# Patient Record
Sex: Female | Born: 1990 | Race: White | Hispanic: No | Marital: Married | Smoking: Never smoker
Health system: Southern US, Community
[De-identification: ages and names within clinical notes are randomized; demographics above are authoritative.]

## PROBLEM LIST (undated history)

## (undated) ENCOUNTER — Inpatient Hospital Stay (HOSPITAL_COMMUNITY): Payer: Self-pay

## (undated) DIAGNOSIS — F32A Depression, unspecified: Secondary | ICD-10-CM

## (undated) DIAGNOSIS — F419 Anxiety disorder, unspecified: Secondary | ICD-10-CM

## (undated) DIAGNOSIS — F329 Major depressive disorder, single episode, unspecified: Secondary | ICD-10-CM

## (undated) DIAGNOSIS — G43909 Migraine, unspecified, not intractable, without status migrainosus: Secondary | ICD-10-CM

## (undated) DIAGNOSIS — I10 Essential (primary) hypertension: Secondary | ICD-10-CM

## (undated) DIAGNOSIS — Z9089 Acquired absence of other organs: Secondary | ICD-10-CM

## (undated) DIAGNOSIS — IMO0002 Reserved for concepts with insufficient information to code with codable children: Secondary | ICD-10-CM

## (undated) DIAGNOSIS — E282 Polycystic ovarian syndrome: Secondary | ICD-10-CM

## (undated) DIAGNOSIS — Z915 Personal history of self-harm: Secondary | ICD-10-CM

## (undated) DIAGNOSIS — B019 Varicella without complication: Secondary | ICD-10-CM

## (undated) DIAGNOSIS — K08409 Partial loss of teeth, unspecified cause, unspecified class: Secondary | ICD-10-CM

## (undated) DIAGNOSIS — Z9151 Personal history of suicidal behavior: Secondary | ICD-10-CM

## (undated) HISTORY — PX: TONSILLECTOMY: SUR1361

## (undated) HISTORY — DX: Reserved for concepts with insufficient information to code with codable children: IMO0002

## (undated) HISTORY — DX: Varicella without complication: B01.9

## (undated) HISTORY — DX: Depression, unspecified: F32.A

## (undated) HISTORY — DX: Major depressive disorder, single episode, unspecified: F32.9

## (undated) HISTORY — DX: Anxiety disorder, unspecified: F41.9

## (undated) NOTE — *Deleted (*Deleted)
Great to see you again today!  

---
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
## 2011-10-20 ENCOUNTER — Encounter (HOSPITAL_BASED_OUTPATIENT_CLINIC_OR_DEPARTMENT_OTHER): Payer: Self-pay | Admitting: *Deleted

## 2011-10-20 ENCOUNTER — Emergency Department (HOSPITAL_BASED_OUTPATIENT_CLINIC_OR_DEPARTMENT_OTHER)
Admission: EM | Admit: 2011-10-20 | Discharge: 2011-10-20 | Disposition: A | Discharge status: Home / Self Care | Payer: 59 | Attending: Emergency Medicine | Admitting: Emergency Medicine

## 2011-10-20 DIAGNOSIS — J029 Acute pharyngitis, unspecified: Secondary | ICD-10-CM | POA: Insufficient documentation

## 2011-10-20 HISTORY — DX: Polycystic ovarian syndrome: E28.2

## 2011-10-20 HISTORY — DX: Migraine, unspecified, not intractable, without status migrainosus: G43.909

## 2011-10-20 LAB — URINALYSIS, ROUTINE W REFLEX MICROSCOPIC
Nitrite: POSITIVE — AB
Protein, ur: 100 mg/dL — AB
Urobilinogen, UA: 1 mg/dL (ref 0.0–1.0)

## 2011-10-20 LAB — RAPID STREP SCREEN (MED CTR MEBANE ONLY): Streptococcus, Group A Screen (Direct): NEGATIVE

## 2011-10-20 LAB — URINE MICROSCOPIC-ADD ON

## 2011-10-20 LAB — PREGNANCY, URINE: Preg Test, Ur: NEGATIVE

## 2011-10-20 MED ORDER — ONDANSETRON HCL 4 MG/2ML IJ SOLN
4.0000 mg | Freq: Once | INTRAMUSCULAR | Status: AC
Start: 2011-10-20 — End: 2011-10-20
  Administered 2011-10-20: 4 mg via INTRAVENOUS
  Filled 2011-10-20: qty 2

## 2011-10-20 MED ORDER — SODIUM CHLORIDE 0.9 % IV SOLN
Freq: Once | INTRAVENOUS | Status: AC
  Administered 2011-10-20: 14:00:00 via INTRAVENOUS

## 2011-10-20 MED ORDER — IBUPROFEN 800 MG PO TABS: 800.0000 mg | ORAL_TABLET | Freq: Three times a day (TID) | ORAL | Status: AC

## 2011-10-20 MED ORDER — IBUPROFEN 800 MG PO TABS
800.0000 mg | ORAL_TABLET | Freq: Once | ORAL | Status: AC
  Administered 2011-10-20: 800 mg via ORAL
  Filled 2011-10-20: qty 1

## 2011-10-20 MED ORDER — PROMETHAZINE HCL 25 MG RE SUPP: 25.0000 mg | Freq: Four times a day (QID) | RECTAL | Status: DC | PRN

## 2011-10-20 MED ORDER — SODIUM CHLORIDE 0.9 % IV BOLUS (SEPSIS)
1000.0000 mL | Freq: Once | INTRAVENOUS | Status: AC
  Administered 2011-10-20: 1000 mL via INTRAVENOUS

## 2011-10-20 NOTE — ED Notes (Signed)
Ice chips given for po challenge

## 2011-10-20 NOTE — ED Provider Notes (Signed)
History     CSN: [id number]  Arrival date & time 10/20/11  1235   First MD Initiated Contact with Patient 10/20/11 1334      Chief Complaint  Patient presents with  . Sore Throat    (Consider location/radiation/quality/duration/timing/severity/associated sxs/prior treatment) Patient is a 21 y.o. female presenting with pharyngitis. The history is provided by the patient. No language interpreter was used.  Sore Throat This is a new problem. The current episode started today. The problem occurs constantly. The problem has been gradually worsening. Associated symptoms include a sore throat. Nothing aggravates the symptoms. She has tried nothing for the symptoms. The treatment provided moderate relief.  Pt complains of a sore throat and vomiting.  Mother reports pt has a history of vomitting when she has strep or a throat infection  Past Medical History  Diagnosis Date  . Polycystic ovarian disease   . Migraines     Past Surgical History  Procedure Date  . Tonsillectomy     History reviewed. No pertinent family history.  History  Substance Use Topics  . Smoking status: Never Smoker   . Smokeless tobacco: Not on file  . Alcohol Use: No    OB History    Grav Para Term Preterm Abortions TAB SAB Ect Mult Living                  Review of Systems  HENT: Positive for sore throat.   All other systems reviewed and are negative.    Allergies  Review of patient's allergies indicates no known allergies.  Home Medications   Current Outpatient Rx  Name Route Sig Dispense Refill  . CITALOPRAM HYDROBROMIDE PO Oral Take 1 capsule by mouth daily.    Marland Kitchen OMEPRAZOLE 20 MG PO CPDR Oral Take 20 mg by mouth daily.      BP 127/72  Pulse 130  Temp 101.8 F (38.8 C) (Oral)  Resp 20  SpO2 97%  LMP 10/13/2011  Physical Exam  Nursing note and vitals reviewed. Constitutional: She appears well-developed and well-nourished.  HENT:  Head: Normocephalic and atraumatic.  Right Ear:  External ear normal.  Left Ear: External ear normal.       Throat erythematous  Eyes: Conjunctivae and EOM are normal. Pupils are equal, round, and reactive to light.  Neck: Normal range of motion. Neck supple.  Cardiovascular: Normal rate and normal heart sounds.   Pulmonary/Chest: Effort normal and breath sounds normal.  Abdominal: Soft.  Musculoskeletal: Normal range of motion.  Neurological: She is alert.  Skin: Skin is warm.  Psychiatric: She has a normal mood and affect.    ED Course  Procedures (including critical care time)   Labs Reviewed  RAPID STREP SCREEN   No results found.   1. Pharyngitis       MDM  Pt given Iv fluids x 1 liter,  IV zofran Pt feels better, temp decreased.   Mother request rx for phenergan suppositories for pt.  I advised ibuprofen for fever.          Lonia Skinner [place], [place] 10/20/11 1702  Lonia Skinner [place], [place] 10/20/11 [phone]

## 2011-10-20 NOTE — ED Notes (Signed)
Sore throat Thursday. Began vomiting yest. Took Zofran without relief.

## 2011-10-21 NOTE — ED Provider Notes (Signed)
History/physical exam/procedure(s) were performed by non-physician practitioner and as supervising physician I was immediately available for consultation/collaboration. I have reviewed all notes and am in agreement with care and plan.   Darald Uzzle S Yareni Creps, MD 10/21/11 2308 

## 2011-10-24 ENCOUNTER — Ambulatory Visit (INDEPENDENT_AMBULATORY_CARE_PROVIDER_SITE_OTHER): Payer: 59 | Admitting: Family Medicine

## 2011-10-24 VITALS — BP 96/63 | HR 89 | Temp 98.2°F | Resp 18

## 2011-10-24 DIAGNOSIS — N390 Urinary tract infection, site not specified: Secondary | ICD-10-CM

## 2011-10-24 DIAGNOSIS — B9789 Other viral agents as the cause of diseases classified elsewhere: Secondary | ICD-10-CM

## 2011-10-24 DIAGNOSIS — B349 Viral infection, unspecified: Secondary | ICD-10-CM

## 2011-10-24 DIAGNOSIS — R1110 Vomiting, unspecified: Secondary | ICD-10-CM

## 2011-10-24 DIAGNOSIS — J029 Acute pharyngitis, unspecified: Secondary | ICD-10-CM

## 2011-10-24 DIAGNOSIS — R509 Fever, unspecified: Secondary | ICD-10-CM

## 2011-10-24 DIAGNOSIS — E860 Dehydration: Secondary | ICD-10-CM

## 2011-10-24 LAB — POCT CBC
HCT, POC: 40.1 % (ref 37.7–47.9)
Lymph, poc: 1.2 (ref 0.6–3.4)
MCHC: 30.7 g/dL — AB (ref 31.8–35.4)
MCV: 90.1 fL (ref 80–97)
MID (cbc): 0.5 (ref 0–0.9)
POC Granulocyte: 4.9 (ref 2–6.9)
POC LYMPH PERCENT: 17.9 %L (ref 10–50)
Platelet Count, POC: 221 10*3/uL (ref 142–424)
RDW, POC: 14.5 %

## 2011-10-24 LAB — POCT URINALYSIS DIPSTICK
Nitrite, UA: NEGATIVE
Protein, UA: 100
Urobilinogen, UA: 1
pH, UA: 6

## 2011-10-24 LAB — COMPREHENSIVE METABOLIC PANEL
ALT: 54 U/L — ABNORMAL HIGH (ref 0–35)
AST: 50 U/L — ABNORMAL HIGH (ref 0–37)
Alkaline Phosphatase: 93 U/L (ref 39–117)
Creat: 0.71 mg/dL (ref 0.50–1.10)
Total Bilirubin: 0.5 mg/dL (ref 0.3–1.2)

## 2011-10-24 LAB — POCT RAPID STREP A (OFFICE): Rapid Strep A Screen: NEGATIVE

## 2011-10-24 LAB — POCT UA - MICROSCOPIC ONLY
Crystals, Ur, HPF, POC: NEGATIVE
Yeast, UA: NEGATIVE

## 2011-10-24 MED ORDER — ONDANSETRON 4 MG PO TBDP: 4.0000 mg | ORAL_TABLET | Freq: Three times a day (TID) | ORAL | Status: DC | PRN

## 2011-10-24 MED ORDER — ONDANSETRON 4 MG PO TBDP
4.0000 mg | ORAL_TABLET | Freq: Once | ORAL | Status: AC
  Administered 2011-10-24: 4 mg via ORAL

## 2011-10-24 MED ORDER — IBUPROFEN 200 MG PO TABS
800.0000 mg | ORAL_TABLET | Freq: Once | ORAL | Status: AC
  Administered 2011-10-24: 800 mg via ORAL

## 2011-10-24 MED ORDER — CIPROFLOXACIN HCL 250 MG PO TABS: 250.0000 mg | ORAL_TABLET | Freq: Two times a day (BID) | ORAL | Status: AC

## 2011-10-24 NOTE — Patient Instructions (Signed)
Take meds as ordered  Drink lots of fluids

## 2011-10-24 NOTE — Progress Notes (Signed)
Subjective: 21 year old lady with history of having been sick since Friday. She was delivered through and evaluated. She is vomiting. She received 2 units of fluids. Strep test was negative. In reviewing her ER record it appears that she had some pyuria at that time. She was treated for UTI however. She was to with Phenergan for nausea. She is persisted in having nausea and vomiting. She is achy. She has headache. She does not have a sore throat. She did not: Cough, that's doing the discomfort from vomiting.  Objective: Overweight white female who looks like she does not feel well. Her TMs are normal. Throat is moist. Pharynx is erythematous on both sides. Strep screen is taken. Neck supple without significant nodes. Chest is clear to auscultation. Heart tachycardic at 116. No murmurs. Abdomen had bowel sounds. Soft without masses or specific tenderness. Extremities are without edema. Skin without rashes, warm and dry.  Assessment: Vomiting Tachycardia. Probable dehydration Probable UTI  Plan: IV fluids, all the labs are pending. Zofran for nausea. Results for orders placed in visit on 10/24/11  POCT RAPID STREP A (OFFICE)      Component Value Range   Rapid Strep A Screen Negative  Negative  POCT CBC      Component Value Range   WBC 6.5  4.6 - 10.2 K/uL   Lymph, poc 1.2  0.6 - 3.4   POC LYMPH PERCENT 17.9  10 - 50 %L   MID (cbc) 0.5  0 - 0.9   POC MID % 7.4  0 - 12 %M   POC Granulocyte 4.9  2 - 6.9   Granulocyte percent 74.7  37 - 80 %G   RBC 4.45  4.04 - 5.48 M/uL   Hemoglobin 12.3  12.2 - 16.2 g/dL   HCT, POC 16.1  09.6 - 47.9 %   MCV 90.1  80 - 97 fL   MCH, POC 27.6  27 - 31.2 pg   MCHC 30.7 (*) 31.8 - 35.4 g/dL   RDW, POC 04.5     Platelet Count, POC 221  142 - 424 K/uL   MPV 8.4  0 - 99.8 fL  POCT UA - MICROSCOPIC ONLY      Component Value Range   WBC, Ur, HPF, POC 5-10     RBC, urine, microscopic 15-20     Bacteria, U Microscopic 2+     Mucus, UA trace     Epithelial  cells, urine per micros 5-8     Crystals, Ur, HPF, POC neg     Casts, Ur, LPF, POC neg     Yeast, UA neg    POCT URINALYSIS DIPSTICK      Component Value Range   Color, UA amber     Clarity, UA cloudy     Glucose, UA neg     Bilirubin, UA small     Ketones, UA 15     Spec Grav, UA 1.015     Blood, UA large     pH, UA 6.0     Protein, UA 100     Urobilinogen, UA 1.0     Nitrite, UA neg     Leukocytes, UA small (1+)     Patient here over 3 hrs, checked several times, while treated in house with iv fluids.  Feels better.  Ate crackers.

## 2011-10-27 LAB — URINE CULTURE

## 2011-11-01 ENCOUNTER — Ambulatory Visit: Payer: Self-pay | Admitting: Family Medicine

## 2011-11-01 ENCOUNTER — Encounter: Payer: Self-pay | Admitting: Family Medicine

## 2011-11-01 ENCOUNTER — Ambulatory Visit (INDEPENDENT_AMBULATORY_CARE_PROVIDER_SITE_OTHER): Payer: 59 | Admitting: Family Medicine

## 2011-11-01 VITALS — BP 121/72 | HR 78 | Temp 98.0°F | Ht 65.75 in | Wt 223.0 lb

## 2011-11-01 DIAGNOSIS — F419 Anxiety disorder, unspecified: Secondary | ICD-10-CM | POA: Insufficient documentation

## 2011-11-01 DIAGNOSIS — F341 Dysthymic disorder: Secondary | ICD-10-CM

## 2011-11-01 DIAGNOSIS — G43909 Migraine, unspecified, not intractable, without status migrainosus: Secondary | ICD-10-CM | POA: Insufficient documentation

## 2011-11-01 DIAGNOSIS — K219 Gastro-esophageal reflux disease without esophagitis: Secondary | ICD-10-CM | POA: Insufficient documentation

## 2011-11-01 DIAGNOSIS — F329 Major depressive disorder, single episode, unspecified: Secondary | ICD-10-CM

## 2011-11-01 MED ORDER — OMEPRAZOLE 40 MG PO CPDR: 40.0000 mg | DELAYED_RELEASE_CAPSULE | Freq: Every day | ORAL | Status: DC

## 2011-11-01 MED ORDER — DESOGESTREL-ETHINYL ESTRADIOL 0.15-0.02/0.01 MG (21/5) PO TABS: 1.0000 | ORAL_TABLET | Freq: Every day | ORAL | Status: DC

## 2011-11-01 MED ORDER — CITALOPRAM HYDROBROMIDE 40 MG PO TABS: 40.0000 mg | ORAL_TABLET | Freq: Every day | ORAL | Status: DC
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
## 2011-11-01 NOTE — Assessment & Plan Note (Signed)
New to provider.  Chronic for pt.  Not well controlled.  Previously on topamax w/out relief.  Pt not interested in seeing neuro at this time- 'i've already done that'.  Will continue to monitor and tx prn.  Pt expressed understanding and is in agreement w/ plan.

## 2011-11-01 NOTE — Patient Instructions (Addendum)
Schedule your complete physical at your convenience We sent all your meds to the [facility] No changes at this time Think of [place] as your home base Welcome!  We're glad to have you!!!

## 2011-11-01 NOTE — Assessment & Plan Note (Signed)
New to provider.  Chronic for pt.  In computer 20mg  dose is listed, pt has 40mg  listed on her new pt form.  sxs currently well controlled.  Asymptomatic.  Pt given script for 40mg .  Will follow.

## 2011-11-01 NOTE — Assessment & Plan Note (Signed)
New to provider.  Chronic for pt.  Feels sxs are well controlled on current meds.  Denies SI/HI.  Has previously in been counseling- doesn't feel she needs it at this time.  Refill on meds provided.  Will follow closely.

## 2011-11-21 ENCOUNTER — Encounter: Payer: Self-pay | Admitting: Family Medicine

## 2011-12-10 ENCOUNTER — Encounter: Payer: 59 | Admitting: Family Medicine

## 2011-12-18 ENCOUNTER — Ambulatory Visit (INDEPENDENT_AMBULATORY_CARE_PROVIDER_SITE_OTHER): Payer: 59 | Admitting: Family Medicine

## 2011-12-18 ENCOUNTER — Other Ambulatory Visit (HOSPITAL_COMMUNITY)
Admission: RE | Admit: 2011-12-18 | Discharge: 2011-12-18 | Disposition: A | Discharge status: Home / Self Care | Payer: 59 | Source: Ambulatory Visit | Attending: Family Medicine | Admitting: Family Medicine

## 2011-12-18 ENCOUNTER — Encounter: Payer: Self-pay | Admitting: Family Medicine

## 2011-12-18 VITALS — BP 124/74 | HR 112 | Temp 97.8°F | Ht 65.0 in | Wt 228.0 lb

## 2011-12-18 DIAGNOSIS — Z20828 Contact with and (suspected) exposure to other viral communicable diseases: Secondary | ICD-10-CM

## 2011-12-18 DIAGNOSIS — Z23 Encounter for immunization: Secondary | ICD-10-CM

## 2011-12-18 DIAGNOSIS — Z01419 Encounter for gynecological examination (general) (routine) without abnormal findings: Secondary | ICD-10-CM | POA: Insufficient documentation

## 2011-12-18 DIAGNOSIS — Z113 Encounter for screening for infections with a predominantly sexual mode of transmission: Secondary | ICD-10-CM | POA: Insufficient documentation

## 2011-12-18 DIAGNOSIS — Z124 Encounter for screening for malignant neoplasm of cervix: Secondary | ICD-10-CM | POA: Insufficient documentation

## 2011-12-18 LAB — BASIC METABOLIC PANEL
BUN: 9 mg/dL (ref 6–23)
Chloride: 104 mEq/L (ref 96–112)
GFR: 121.35 mL/min (ref >60.00)
Glucose, Bld: 106 mg/dL — ABNORMAL HIGH (ref 70–99)
Potassium: 4 mEq/L (ref 3.5–5.1)

## 2011-12-18 LAB — TSH: TSH: 1.16 u[IU]/mL (ref 0.35–5.50)

## 2011-12-18 LAB — CBC WITH DIFFERENTIAL/PLATELET
Basophils Relative: 0.5 % (ref 0.0–3.0)
Eosinophils Relative: 1.8 % (ref 0.0–5.0)
MCV: 87.6 fl (ref 78.0–100.0)
Monocytes Absolute: 0.6 10*3/uL (ref 0.1–1.0)
Neutrophils Relative %: 56.5 % (ref 43.0–77.0)
RBC: 4.44 Mil/uL (ref 3.87–5.11)
WBC: 7.3 10*3/uL (ref 4.5–10.5)

## 2011-12-18 LAB — HEPATIC FUNCTION PANEL
ALT: 68 U/L — ABNORMAL HIGH (ref 0–35)
AST: 50 U/L — ABNORMAL HIGH (ref 0–37)
Albumin: 3.8 g/dL (ref 3.5–5.2)
Total Protein: 8.1 g/dL (ref 6.0–8.3)

## 2011-12-18 LAB — LDL CHOLESTEROL, DIRECT: Direct LDL: 135.9 mg/dL

## 2011-12-18 NOTE — Patient Instructions (Addendum)
Schedule a nurse visit in 2 months for your 2nd shot We'll notify you of your lab results Try and make healthy food choices and get regular exercise Call with any questions or concerns Happy [birth year] Birthday!

## 2011-12-18 NOTE — Assessment & Plan Note (Signed)
Pt's PE WNL w/ exception of weight.  Check labs.  Anticipatory guidance provided.  

## 2011-12-18 NOTE — Assessment & Plan Note (Signed)
Pap collected. 

## 2011-12-18 NOTE — Progress Notes (Signed)
  Subjective:    Patient ID: Sandy Perez, female    DOB: Oct 26, 1990, 21 y.o.   MRN: [id number]  HPI CPE- no concerns.  Due for pap.     Review of Systems Patient reports no vision/ hearing changes, adenopathy,fever, weight change,  persistant/recurrent hoarseness , swallowing issues, chest pain, palpitations, edema, persistant/recurrent cough, hemoptysis, dyspnea (rest/exertional/paroxysmal nocturnal), gastrointestinal bleeding (melena, rectal bleeding), abdominal pain, significant heartburn, bowel changes, GU symptoms (dysuria, hematuria, incontinence), Gyn symptoms (abnormal  bleeding, pain),  syncope, focal weakness, memory loss, numbness & tingling, skin/hair/nail changes, abnormal bruising or bleeding, anxiety, or depression.     Objective:   Physical Exam  General Appearance:    Alert, cooperative, no distress, appears stated age, overweight  Head:    Normocephalic, without obvious abnormality, atraumatic  Eyes:    PERRL, conjunctiva/corneas clear, EOM's intact, fundi    benign, both eyes  Ears:    Normal TM's and external ear canals, both ears  Nose:   Nares normal, septum midline, mucosa normal, no drainage    or sinus tenderness  Throat:   Lips, mucosa, and tongue normal; teeth and gums normal  Neck:   Supple, symmetrical, trachea midline, no adenopathy;    Thyroid: no enlargement/tenderness/nodules  Back:     Symmetric, no curvature, ROM normal, no CVA tenderness  Lungs:     Clear to auscultation bilaterally, respirations unlabored  Chest Wall:    No tenderness or deformity   Heart:    Regular rate and rhythm, S1 and S2 normal, no murmur, rub   or gallop  Breast Exam:    No tenderness, masses, or nipple abnormality  Abdomen:     Soft, non-tender, bowel sounds active all four quadrants,    no masses, no organomegaly  Genitalia:    External genitalia normal, cervix normal in appearance, no CMT, uterus in normal size and position, adnexa w/out mass or tenderness, mucosa pink  and moist, no lesions or discharge present  Rectal:    Normal external appearance  Extremities:   Extremities normal, atraumatic, no cyanosis or edema  Pulses:   2+ and symmetric all extremities  Skin:   Skin color, texture, turgor normal, no rashes or lesions  Lymph nodes:   Cervical, supraclavicular, and axillary nodes normal  Neurologic:   CNII-XII intact, normal strength, sensation and reflexes    throughout          Assessment & Plan:

## 2011-12-19 LAB — RPR

## 2011-12-19 LAB — HIV ANTIBODY (ROUTINE TESTING W REFLEX): HIV: NONREACTIVE

## 2011-12-21 ENCOUNTER — Telehealth: Payer: Self-pay

## 2011-12-21 MED ORDER — FENOFIBRATE 160 MG PO TABS: 160.0000 mg | ORAL_TABLET | Freq: Every day | ORAL | Status: DC

## 2011-12-21 MED ORDER — AZITHROMYCIN 500 MG PO TABS: 1000.0000 mg | ORAL_TABLET | Freq: Once | ORAL | Status: DC

## 2011-12-21 NOTE — Telephone Encounter (Signed)
Message copied by Arnette Norris on Fri Dec 21, 2011  8:07 AM ------      Message from: Sheliah Hatch      Created: Tue Dec 18, 2011  7:54 PM       LFTs are mildly elevated and triglycerides are very high.  Needs to start Fenofibrate 160mg  to improve trigs- this will also improve w/ attention to diet and exercise.  Will need OV in 6 months to recheck labs

## 2011-12-21 NOTE — Telephone Encounter (Signed)
Notes Recorded by Arnette Norris, CMA on 12/20/2011 at 5:37 PM msg left to call the office KP Notes Recorded by Sheliah Hatch, MD on 12/19/2011 at 8:30 PM Pt is + for chlamydia. Needs to take Azithromycin 500mg - 2 tabs at same time x1 dose. Partner(s) also need to be treated. Please refer case to health dept Notes Recorded by Ovidio Kin, LPN on 21/30/8657 at 5:32 PM Noted message left for pt by MW today, per noted in lab result addition to this report Notes Recorded by Mal Amabile, MA on 12/19/2011 at 11:41 AM Called left message on pt vm. MW Notes Recorded by Sheliah Hatch, MD on 12/19/2011 at 8:03 AM Negative for HIV and syphilis Notes Recorded by Sheliah Hatch, MD on 12/18/2011 at 7:54 PM LFTs are mildly elevated and triglycerides are very high. Needs to start Fenofibrate 160mg  to improve trigs- this will also improve w/ attention to diet and exercise. Will need OV in 6 months to recheck labs

## 2011-12-21 NOTE — Telephone Encounter (Signed)
Discussed with patient and she voiced understanding of all lab results. RX called to the pharmacy.     KP

## 2011-12-23 LAB — VITAMIN D 1,25 DIHYDROXY
Vitamin D2 1, 25 (OH)2: <8 pg/mL
Vitamin D3 1, 25 (OH)2: 59 pg/mL

## 2012-02-07 ENCOUNTER — Ambulatory Visit (INDEPENDENT_AMBULATORY_CARE_PROVIDER_SITE_OTHER): Payer: 59

## 2012-02-07 DIAGNOSIS — Z23 Encounter for immunization: Secondary | ICD-10-CM

## 2012-02-07 MED ORDER — HPV QUADRIVALENT VACCINE IM SUSP: 0.5000 mL | Freq: Once | INTRAMUSCULAR | Status: DC

## 2012-05-22 ENCOUNTER — Ambulatory Visit (INDEPENDENT_AMBULATORY_CARE_PROVIDER_SITE_OTHER): Payer: 59 | Admitting: Family Medicine

## 2012-05-22 ENCOUNTER — Encounter: Payer: Self-pay | Admitting: Family Medicine

## 2012-05-22 VITALS — BP 130/90 | HR 122 | Temp 98.4°F | Ht 67.75 in | Wt 234.0 lb

## 2012-05-22 DIAGNOSIS — J020 Streptococcal pharyngitis: Secondary | ICD-10-CM

## 2012-05-22 DIAGNOSIS — J029 Acute pharyngitis, unspecified: Secondary | ICD-10-CM | POA: Insufficient documentation

## 2012-05-22 NOTE — Patient Instructions (Addendum)
Good news/bad news- not strep! Drink plenty of fluids Alternate tylenol/ibuprofen every 4 hrs for pain and fever Drink plenty of fluids You're appetite will come back when it's ready Call w/ any questions or concerns Hang in there!!

## 2012-05-22 NOTE — Assessment & Plan Note (Signed)
New.  Rapid strep negative and pt w/out overly impressive PE suggestive of dx.  Suspect viral illness.  No abx at this time.  Reviewed supportive care and red flags that should prompt return.  Pt expressed understanding and is in agreement w/ plan.

## 2012-05-22 NOTE — Progress Notes (Signed)
  Subjective:    Patient ID: Sandy Perez, female    DOB: 1990-12-07, 22 y.o.   MRN: [id number]  HPI 'i can't swallow'- sxs started yesterday.  Alternating chills and sweats.  Mild cough.  No swollen glands.  + nausea.  + anorexia.  No nasal congestion, no sinus pain/pressure.  No ear pain.  No known sick contacts.   Review of Systems For ROS see HPI     Objective:   Physical Exam  Vitals reviewed. Constitutional: She appears well-developed and well-nourished. No distress.  HENT:  Head: Normocephalic and atraumatic.  Nose: Nose normal.  TMs normal bilaterally No TTP over sinuses Bilateral tonsillar enlargement w/ erythema but no exudate  Neck: Normal range of motion. Neck supple.  Cardiovascular: Normal rate, regular rhythm and normal heart sounds.   Pulmonary/Chest: Effort normal and breath sounds normal. No respiratory distress. She has no wheezes. She has no rales.  Lymphadenopathy:    She has cervical adenopathy.          Assessment & Plan:

## 2012-06-17 ENCOUNTER — Telehealth: Payer: Self-pay | Admitting: Family Medicine

## 2012-06-17 NOTE — Telephone Encounter (Signed)
Patient would like to know when 3rd guardisil injection is due.

## 2012-06-17 NOTE — Telephone Encounter (Signed)
Spoke with the pt and informed her that she is due now for her 3rd Gardasil.  Pt agreed and scheduled an appt.//AB/CMA

## 2012-06-18 ENCOUNTER — Ambulatory Visit (INDEPENDENT_AMBULATORY_CARE_PROVIDER_SITE_OTHER): Payer: 59 | Admitting: *Deleted

## 2012-06-18 DIAGNOSIS — Z23 Encounter for immunization: Secondary | ICD-10-CM

## 2012-07-28 ENCOUNTER — Encounter: Payer: Self-pay | Admitting: Family Medicine

## 2012-07-28 ENCOUNTER — Ambulatory Visit (INDEPENDENT_AMBULATORY_CARE_PROVIDER_SITE_OTHER): Payer: 59 | Admitting: Family Medicine

## 2012-07-28 VITALS — BP 130/70 | HR 107 | Temp 98.3°F | Wt 237.4 lb

## 2012-07-28 DIAGNOSIS — N390 Urinary tract infection, site not specified: Secondary | ICD-10-CM

## 2012-07-28 DIAGNOSIS — F341 Dysthymic disorder: Secondary | ICD-10-CM

## 2012-07-28 DIAGNOSIS — N912 Amenorrhea, unspecified: Secondary | ICD-10-CM | POA: Insufficient documentation

## 2012-07-28 DIAGNOSIS — Z8742 Personal history of other diseases of the female genital tract: Secondary | ICD-10-CM

## 2012-07-28 DIAGNOSIS — F418 Other specified anxiety disorders: Secondary | ICD-10-CM

## 2012-07-28 LAB — POCT URINALYSIS DIPSTICK
Bilirubin, UA: NEGATIVE
Ketones, UA: NEGATIVE
Protein, UA: 300
Spec Grav, UA: 1.01
pH, UA: 7.5

## 2012-07-28 LAB — POCT URINE PREGNANCY: Preg Test, Ur: NEGATIVE

## 2012-07-28 MED ORDER — DULOXETINE HCL 60 MG PO CPEP: 60.0000 mg | ORAL_CAPSULE | Freq: Every day | ORAL | Status: DC

## 2012-07-28 NOTE — Assessment & Plan Note (Signed)
Refer to gyn for further eval

## 2012-07-28 NOTE — Assessment & Plan Note (Signed)
Check bhcg Refer to gyn for pcos cymbalta may cause some menstrual abnormalities

## 2012-07-28 NOTE — Patient Instructions (Signed)
Polycystic Ovarian Syndrome  Polycystic ovarian syndrome is a condition with a number of problems. One problem is with the ovaries. The ovaries are organs located in the female pelvis, on each side of the uterus. Usually, during the menstrual cycle, an egg is released from 1 ovary every month. This is called ovulation. When the egg is fertilized, it goes into the womb (uterus), which allows for the growth of a baby. The egg travels from the ovary through the fallopian tube to the uterus. The ovaries also make the hormones estrogen and progesterone. These hormones help the development of a woman's breasts, body shape, and body hair. They also regulate the menstrual cycle and pregnancy.  Sometimes, cysts form in the ovaries. A cyst is a fluid-filled sac. On the ovary, different types of cysts can form. The most common type of ovarian cyst is called a functional or ovulation cyst. It is normal, and often forms during the normal menstrual cycle. Each month, a woman's ovaries grow tiny cysts that hold the eggs. When an egg is fully grown, the sac breaks open. This releases the egg. Then, the sac which released the egg from the ovary dissolves. In one type of functional cyst, called a follicle cyst, the sac does not break open to release the egg. It may actually continue to grow. This type of cyst usually disappears within 1 to 3 months.   One type of cyst problem with the ovaries is called Polycystic Ovarian Syndrome (PCOS). In this condition, many follicle cysts form, but do not rupture and produce an egg. This health problem can affect the following:  · Menstrual cycle.  · Heart.  · Obesity.  · Cancer of the uterus.  · Fertility.  · Blood vessels.  · Hair growth (face and body) or baldness.  · Hormones.  · Appearance.  · High blood pressure.  · Stroke.  · Insulin production.  · Inflammation of the liver.  · Elevated blood cholesterol and triglycerides.  CAUSES   · No one knows the exact cause of PCOS.  · Women with  PCOS often have a mother or sister with PCOS. There is not yet enough proof to say this is inherited.  · Many women with PCOS have a weight problem.  · Researchers are looking at the relationship between PCOS and the body's ability to make insulin. Insulin is a hormone that regulates the change of sugar, starches, and other food into energy for the body's use, or for storage. Some women with PCOS make too much insulin. It is possible that the ovaries react by making too many female hormones, called androgens. This can lead to acne, excessive hair growth, weight gain, and ovulation problems.  · Too much production of luteinizing hormone (LH) from the pituitary gland in the brain stimulates the ovary to produce too much female hormone (androgen).  SYMPTOMS   · Infrequent or no menstrual periods, and/or irregular bleeding.  · Inability to get pregnant (infertility), because of not ovulating.  · Increased growth of hair on the face, chest, stomach, back, thumbs, thighs, or toes.  · Acne, oily skin, or dandruff.  · Pelvic pain.  · Weight gain or obesity, usually carrying extra weight around the waist.  · Type 2 diabetes (this is the diabetes that usually does not need insulin).  · High cholesterol.  · High blood pressure.  · Female-pattern baldness or thinning hair.  · Patches of thickened and dark brown or black skin on the neck, arms, breasts,   or thighs.  · Skin tags, or tiny excess flaps of skin, in the armpits or neck area.  · Sleep apnea (excessive snoring and breathing stops at times while asleep).  · Deepening of the voice.  · Gestational diabetes when pregnant.  · Increased risk of miscarriage with pregnancy.  DIAGNOSIS   There is no single test to diagnose PCOS.   · Your caregiver will:  · Take a medical history.  · Perform a pelvic exam.  · Perform an ultrasound.  · Check your female and female hormone levels.  · Measure glucose or sugar levels in the blood.  · Do other blood tests.  · If you are producing too many  female hormones, your caregiver will make sure it is from PCOS. At the physical exam, your caregiver will want to evaluate the areas of increased hair growth. Try to allow natural hair growth for a few days before the visit.  · During a pelvic exam, the ovaries may be enlarged or swollen by the increased number of small cysts. This can be seen more easily by vaginal ultrasound or screening, to examine the ovaries and lining of the uterus (endometrium) for cysts. The uterine lining may become thicker, if there has not been a regular period.  TREATMENT   Because there is no cure for PCOS, it needs to be managed to prevent problems. Treatments are based on your symptoms. Treatment is also based on whether you want to have a baby or whether you need contraception.   Treatment may include:  · Progesterone hormone, to start a menstrual period.  · Birth control pills, to make you have regular menstrual periods.  · Medicines to make you ovulate, if you want to get pregnant.  · Medicines to control your insulin.  · Medicine to control your blood pressure.  · Medicine and diet, to control your high cholesterol and triglycerides in your blood.  · Surgery, making small holes in the ovary, to decrease the amount of female hormone production. This is done through a long, lighted tube (laparoscope), placed into the pelvis through a tiny incision in the lower abdomen.  Your caregiver will go over some of the choices with you.  WOMEN WITH PCOS HAVE THESE CHARACTERISTICS:  · High levels of female hormones called androgens.  · An irregular or no menstrual cycle.  · May have many small cysts in their ovaries.  PCOS is the most common hormonal reproductive problem in women of childbearing age.  WHY DO WOMEN WITH PCOS HAVE TROUBLE WITH THEIR MENSTRUAL CYCLE?  Each month, about 20 eggs start to mature in the ovaries. As one egg grows and matures, the follicle breaks open to release the egg, so it can travel through the fallopian tube for  fertilization. When the single egg leaves the follicle, ovulation takes place. In women with PCOS, the ovary does not make all of the hormones it needs for any of the eggs to fully mature. They may start to grow and accumulate fluid, but no one egg becomes large enough. Instead, some may remain as cysts. Since no egg matures or is released, ovulation does not occur and the hormone progesterone is not made. Without progesterone, a woman's menstrual cycle is irregular or absent. Also, the cysts produce female hormones, which continue to prevent ovulation.   Document Released: 06/01/2004 Document Revised: 04/30/2011 Document Reviewed: 12/24/2008  [employer]® Patient Information ©2014 [employer].

## 2012-07-28 NOTE — Progress Notes (Signed)
  Subjective:    Patient ID: Sandy Perez, female    DOB: 23-Jul-1990, 22 y.o.   MRN: [id number]  HPI Pt here with her mom c/o no period in 2 months.   She is on bcp and about 2 months ago was switched from celexa to cymbalta.  She has a hx of PCOS but has not seen a gyn for this.  Pt states she has done a few preg tests ---- all neg.  No abd pain, cramping, etc.   Review of Systems As above    Objective:   Physical Exam BP 130/70  Pulse 107  Temp(Src) 98.3 F (36.8 C) (Oral)  Wt 237 lb 6.4 oz (107.684 kg)  BMI 36.36 kg/m2  SpO2 98% General appearance: alert, cooperative, appears stated age and no distress Abdomen: soft, non-tender; bowel sounds normal; no masses,  no organomegaly Extremities: extremities normal, atraumatic, no cyanosis or edema        Assessment & Plan:

## 2012-07-29 NOTE — Progress Notes (Signed)
Pt.notified

## 2012-07-29 NOTE — Telephone Encounter (Signed)
Opened in error

## 2012-07-30 LAB — GC/CHLAMYDIA PROBE AMP, URINE
Chlamydia, Swab/Urine, PCR: POSITIVE — AB
GC Probe Amp, Urine: NEGATIVE

## 2012-07-30 LAB — URINE CULTURE: Colony Count: 70000

## 2012-07-31 ENCOUNTER — Other Ambulatory Visit: Payer: Self-pay | Admitting: Family Medicine

## 2012-07-31 ENCOUNTER — Telehealth: Payer: Self-pay | Admitting: Family Medicine

## 2012-07-31 MED ORDER — AZITHROMYCIN 250 MG PO TABS: ORAL_TABLET | ORAL | Status: DC

## 2012-07-31 NOTE — Telephone Encounter (Signed)
Patient states that someone called her today from the office to give her lab results. Unsure of who it was.

## 2012-07-31 NOTE — Telephone Encounter (Signed)
LM @ (3:52pm) asking the pt to RTC regarding recent lab results.//AB/CMA

## 2012-07-31 NOTE — Telephone Encounter (Signed)
Message copied by Verdie Shire on Thu Jul 31, 2012  4:50 PM ------      Message from: Lelon Perla      Created: Thu Jul 31, 2012  9:43 AM       + chlamydia--  Needs to come in  Rocephin 250-500 mg IM      zithromax 250 mg #4  4po x 1 ------

## 2012-07-31 NOTE — Telephone Encounter (Signed)
Spoke with the pt and informed her of recent lab results and note.  Pt was upset but understood and agreed.  Pt was scheduled an nurse visit on Friday(08-01-12) for the Rocephin 250-500.  Also informed the pt that a new rx for the ATB(Zithromax 250mg ) was sent to her pharmacy, so she's to pick it up and take all 4 pills now.  Pt understood.//AB/CMA

## 2012-08-01 ENCOUNTER — Ambulatory Visit (INDEPENDENT_AMBULATORY_CARE_PROVIDER_SITE_OTHER): Payer: 59 | Admitting: *Deleted

## 2012-08-01 ENCOUNTER — Ambulatory Visit: Payer: 59 | Admitting: Family Medicine

## 2012-08-01 DIAGNOSIS — A749 Chlamydial infection, unspecified: Secondary | ICD-10-CM

## 2012-08-01 MED ORDER — CEFTRIAXONE SODIUM 250 MG IJ SOLR: 250.0000 mg | Freq: Once | INTRAMUSCULAR | Status: DC

## 2012-08-11 ENCOUNTER — Ambulatory Visit (INDEPENDENT_AMBULATORY_CARE_PROVIDER_SITE_OTHER): Payer: 59 | Admitting: Family Medicine

## 2012-08-11 ENCOUNTER — Encounter: Payer: Self-pay | Admitting: Family Medicine

## 2012-08-11 VITALS — BP 128/98 | HR 117 | Temp 97.6°F | Wt 237.6 lb

## 2012-08-11 DIAGNOSIS — F341 Dysthymic disorder: Secondary | ICD-10-CM

## 2012-08-11 DIAGNOSIS — A749 Chlamydial infection, unspecified: Secondary | ICD-10-CM

## 2012-08-11 DIAGNOSIS — F329 Major depressive disorder, single episode, unspecified: Secondary | ICD-10-CM

## 2012-08-11 MED ORDER — BUPROPION HCL ER (XL) 150 MG PO TB24: 150.0000 mg | ORAL_TABLET | Freq: Every day | ORAL | Status: DC

## 2012-08-11 MED ORDER — ALBUTEROL SULFATE HFA 108 (90 BASE) MCG/ACT IN AERS: 2.0000 | INHALATION_SPRAY | Freq: Four times a day (QID) | RESPIRATORY_TRACT | Status: DC | PRN

## 2012-08-11 NOTE — Patient Instructions (Addendum)
Follow up in 1 month to recheck anxiety We'll notify you of your lab results Start the Wellbutrin in addition to the Cymbalta daily Use the albuterol inhaler as needed for shortness of breath Call with any questions or concerns Hang in there!
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
## 2012-08-11 NOTE — Assessment & Plan Note (Signed)
Deteriorated.  Pt now having regular panic attacks and stress induced migraines.  Will add Wellbutrin to Cymbalta to better control anxiety.  Pt did not tolerate triptan previously.  Encouraged Excedrin Migraine at onset of HA.  Will not give narcotics at this time.  Also discussed that unless she actually has asthma, albuterol is likely not improving her SOB.  Pt feels it absolutely improves her breathing so will give script so that she can stop using other people's inhalers.  Will follow closely.

## 2012-08-12 LAB — GC/CHLAMYDIA PROBE AMP, URINE: Chlamydia, Swab/Urine, PCR: NEGATIVE

## 2012-09-09 ENCOUNTER — Other Ambulatory Visit: Payer: Self-pay | Admitting: Gastroenterology

## 2012-09-09 DIAGNOSIS — R110 Nausea: Secondary | ICD-10-CM

## 2012-09-16 ENCOUNTER — Telehealth: Payer: Self-pay | Admitting: Family Medicine

## 2012-09-16 NOTE — Telephone Encounter (Signed)
Patient called requesting rx for prilosec be sent to [facility]. She states they told her to call her doctor's office.

## 2012-09-17 ENCOUNTER — Other Ambulatory Visit: Payer: Self-pay | Admitting: Family Medicine

## 2012-09-18 MED ORDER — OMEPRAZOLE 40 MG PO CPDR: 40.0000 mg | DELAYED_RELEASE_CAPSULE | Freq: Every day | ORAL | Status: DC

## 2012-09-18 NOTE — Telephone Encounter (Signed)
Rx sent to the pharmacy by e-script.//AB/CMA 

## 2012-09-19 NOTE — Telephone Encounter (Signed)
Rx sent to the pharmacy by e-script.//AB/CMA 

## 2012-09-23 ENCOUNTER — Encounter (HOSPITAL_COMMUNITY)
Admission: RE | Admit: 2012-09-23 | Discharge: 2012-09-23 | Disposition: A | Discharge status: Home / Self Care | Payer: 59 | Source: Ambulatory Visit | Attending: Gastroenterology | Admitting: Gastroenterology

## 2012-09-23 ENCOUNTER — Ambulatory Visit (HOSPITAL_COMMUNITY)
Admission: RE | Admit: 2012-09-23 | Discharge: 2012-09-23 | Disposition: A | Discharge status: Home / Self Care | Payer: 59 | Source: Ambulatory Visit | Attending: Gastroenterology | Admitting: Gastroenterology

## 2012-09-23 DIAGNOSIS — R110 Nausea: Secondary | ICD-10-CM | POA: Insufficient documentation

## 2012-09-23 DIAGNOSIS — K7689 Other specified diseases of liver: Secondary | ICD-10-CM | POA: Insufficient documentation

## 2012-09-23 DIAGNOSIS — R748 Abnormal levels of other serum enzymes: Secondary | ICD-10-CM | POA: Insufficient documentation

## 2012-09-23 MED ORDER — TECHNETIUM TC 99M MEBROFENIN IV KIT
4.9000 | PACK | Freq: Once | INTRAVENOUS | Status: AC | PRN
  Administered 2012-09-23: 5 via INTRAVENOUS

## 2012-09-23 MED ORDER — SINCALIDE 5 MCG IJ SOLR
0.0200 ug/kg | Freq: Once | INTRAMUSCULAR | Status: AC
  Administered 2012-09-23: 11:00:00 via INTRAVENOUS

## 2012-10-21 ENCOUNTER — Ambulatory Visit (INDEPENDENT_AMBULATORY_CARE_PROVIDER_SITE_OTHER): Payer: 59 | Admitting: Family Medicine

## 2012-10-21 ENCOUNTER — Encounter: Payer: Self-pay | Admitting: Family Medicine

## 2012-10-21 VITALS — BP 140/90 | HR 128 | Temp 98.4°F | Wt 247.6 lb

## 2012-10-21 DIAGNOSIS — R319 Hematuria, unspecified: Secondary | ICD-10-CM

## 2012-10-21 DIAGNOSIS — Z3201 Encounter for pregnancy test, result positive: Secondary | ICD-10-CM

## 2012-10-21 LAB — POCT URINALYSIS DIPSTICK
Bilirubin, UA: NEGATIVE
Glucose, UA: NEGATIVE
Ketones, UA: NEGATIVE
Spec Grav, UA: 1.015

## 2012-10-21 LAB — HCG, QUANTITATIVE, PREGNANCY: hCG, Beta Chain, Quant, S: 155.25 m[IU]/mL

## 2012-10-21 MED ORDER — RANITIDINE HCL 150 MG PO TABS: 150.0000 mg | ORAL_TABLET | Freq: Two times a day (BID) | ORAL | Status: DC

## 2012-10-21 NOTE — Patient Instructions (Addendum)

## 2012-10-21 NOTE — Progress Notes (Signed)
  Subjective:    Patient ID: Sandy Perez, female    DOB: [birth year], 22 y.o.   MRN: [id number]  HPI Pt here to confirm pregnancy.  + home preg test.   Review of Systems As above    Objective:   Physical Exam LMP? 10/11/2012----heavier and shorter than usual EDC 07/18/2013 ega [redacted] week 3 days        Assessment & Plan:  + pregnancy------- PNV                                     Check Bhcg                                     F/u OB                                        There is some concern because period was not completely normal

## 2012-10-22 ENCOUNTER — Telehealth: Payer: Self-pay | Admitting: General Practice

## 2012-10-22 LAB — URINE CULTURE: Colony Count: >=100000

## 2012-10-22 NOTE — Telephone Encounter (Signed)
Left a detailed message informing pt.  

## 2012-10-22 NOTE — Telephone Encounter (Signed)
Pt needs to know date of last period in order to determine dates and OB-GYN doesn't typically see pt's until 8-9 weeks (1 month is normal).  No order for [place] from this office

## 2012-10-22 NOTE — Telephone Encounter (Signed)
Pt called stating that she would like to know how far along she is with her pregnancy. Pt also stated that per mychart she had called her gyn to make an appt. States that she cannot get in there for one month. Is there anything she needs to be concerned about?

## 2012-10-31 ENCOUNTER — Encounter (HOSPITAL_BASED_OUTPATIENT_CLINIC_OR_DEPARTMENT_OTHER): Payer: Self-pay | Admitting: Emergency Medicine

## 2012-10-31 ENCOUNTER — Emergency Department (HOSPITAL_BASED_OUTPATIENT_CLINIC_OR_DEPARTMENT_OTHER)
Admission: EM | Admit: 2012-10-31 | Discharge: 2012-11-01 | Disposition: A | Discharge status: Home / Self Care | Payer: 59 | Attending: Emergency Medicine | Admitting: Emergency Medicine

## 2012-10-31 ENCOUNTER — Emergency Department (HOSPITAL_BASED_OUTPATIENT_CLINIC_OR_DEPARTMENT_OTHER): Payer: 59

## 2012-10-31 DIAGNOSIS — O200 Threatened abortion: Secondary | ICD-10-CM | POA: Insufficient documentation

## 2012-10-31 DIAGNOSIS — R109 Unspecified abdominal pain: Secondary | ICD-10-CM | POA: Insufficient documentation

## 2012-10-31 DIAGNOSIS — Z8679 Personal history of other diseases of the circulatory system: Secondary | ICD-10-CM | POA: Insufficient documentation

## 2012-10-31 DIAGNOSIS — F3289 Other specified depressive episodes: Secondary | ICD-10-CM | POA: Insufficient documentation

## 2012-10-31 DIAGNOSIS — Z79899 Other long term (current) drug therapy: Secondary | ICD-10-CM | POA: Insufficient documentation

## 2012-10-31 DIAGNOSIS — Z8619 Personal history of other infectious and parasitic diseases: Secondary | ICD-10-CM | POA: Insufficient documentation

## 2012-10-31 DIAGNOSIS — F329 Major depressive disorder, single episode, unspecified: Secondary | ICD-10-CM | POA: Insufficient documentation

## 2012-10-31 DIAGNOSIS — R110 Nausea: Secondary | ICD-10-CM | POA: Insufficient documentation

## 2012-10-31 DIAGNOSIS — O9989 Other specified diseases and conditions complicating pregnancy, childbirth and the puerperium: Secondary | ICD-10-CM | POA: Insufficient documentation

## 2012-10-31 LAB — WET PREP, GENITAL: Trich, Wet Prep: NONE SEEN

## 2012-10-31 LAB — URINALYSIS, ROUTINE W REFLEX MICROSCOPIC
Bilirubin Urine: NEGATIVE
Protein, ur: NEGATIVE mg/dL
Urobilinogen, UA: 0.2 mg/dL (ref 0.0–1.0)

## 2012-10-31 LAB — URINE MICROSCOPIC-ADD ON

## 2012-10-31 NOTE — ED Notes (Signed)
Pt reports being [redacted] weeks pregnant and began have mild vaginal bleeding x 2 hours PTA with abd cramping
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
## 2012-11-01 ENCOUNTER — Inpatient Hospital Stay (HOSPITAL_COMMUNITY)
Admission: AD | Admit: 2012-11-01 | Discharge: 2012-11-01 | Disposition: A | Discharge status: Home / Self Care | Payer: 59 | Source: Ambulatory Visit | Attending: Obstetrics and Gynecology | Admitting: Obstetrics and Gynecology

## 2012-11-01 ENCOUNTER — Encounter (HOSPITAL_COMMUNITY): Payer: Self-pay | Admitting: *Deleted

## 2012-11-01 DIAGNOSIS — N949 Unspecified condition associated with female genital organs and menstrual cycle: Secondary | ICD-10-CM | POA: Insufficient documentation

## 2012-11-01 DIAGNOSIS — O039 Complete or unspecified spontaneous abortion without complication: Secondary | ICD-10-CM | POA: Insufficient documentation

## 2012-11-01 LAB — CBC
Hemoglobin: 13.1 g/dL (ref 12.0–15.0)
MCH: 28.9 pg (ref 26.0–34.0)
MCHC: 33.9 g/dL (ref 30.0–36.0)
MCV: 85.2 fL (ref 78.0–100.0)

## 2012-11-01 LAB — ABO/RH
ABO/RH(D): B NEG
ABO/RH(D): B NEG
Antibody Screen: NEGATIVE

## 2012-11-01 LAB — HCG, QUANTITATIVE, PREGNANCY: hCG, Beta Chain, Quant, S: 363 m[IU]/mL — ABNORMAL HIGH (ref <5)

## 2012-11-01 MED ORDER — OXYCODONE-ACETAMINOPHEN 5-325 MG PO TABS
1.0000 | ORAL_TABLET | Freq: Once | ORAL | Status: AC
  Administered 2012-11-01: 1 via ORAL
  Filled 2012-11-01: qty 1

## 2012-11-01 MED ORDER — PROMETHAZINE HCL 25 MG PO TABS: 25.0000 mg | ORAL_TABLET | Freq: Four times a day (QID) | ORAL | Status: DC | PRN

## 2012-11-01 MED ORDER — OXYCODONE-ACETAMINOPHEN 5-325 MG PO TABS: 1.0000 | ORAL_TABLET | ORAL | Status: DC | PRN

## 2012-11-01 MED ORDER — RHO D IMMUNE GLOBULIN 1500 UNIT/2ML IJ SOLN
300.0000 ug | Freq: Once | INTRAMUSCULAR | Status: AC
  Administered 2012-11-01: 300 ug via INTRAMUSCULAR
  Filled 2012-11-01: qty 2

## 2012-11-01 NOTE — ED Notes (Signed)
Pt called at home number. I advised her that she is B negative and that she would need to return to the ER or see her OB/GYN to receive the rhogam injection. Pt verbalized understanding.

## 2012-11-01 NOTE — Discharge Instructions (Signed)
Miscarriage  A miscarriage is the loss of an unborn baby (fetus) before the 20th week of pregnancy. The cause is often unknown.   HOME CARE  · You may need to stay in bed (bed rest), or you may be able to do light activity. Go about activity as told by your doctor.  · Have help at home.  · Write down how many pads you use each day. Write down how soaked they are.  · Do not use tampons. Do not wash out your vagina (douche) or have sex (intercourse) until your doctor approves.  · Only take medicine as told by your doctor.  · Do not take aspirin.  · Keep all doctor visits as told.  · If you or your partner have problems with grieving, talk to your doctor. You can also try counseling. Give yourself time to grieve before trying to get pregnant again.  GET HELP RIGHT AWAY IF:  · You have bad cramps or pain in your back or belly (abdomen).  · You have a fever.  · You pass large clumps of blood (clots) from your vagina that are walnut-sized or larger. Save the clumps for your doctor to see.  · You pass large amounts of tissue from your vagina. Save the tissue for your doctor to see.  · You have more bleeding.  · You have thick, bad-smelling fluid (discharge) coming from the vagina.  · You get lightheaded, weak, or you pass out (faint).  · You have chills.  MAKE SURE YOU:  · Understand these instructions.  · Will watch your condition.  · Will get help right away if you are not doing well or get worse.  Document Released: 04/30/2011 Document Reviewed: 04/30/2011  [employer]® Patient Information ©2014 [employer].

## 2012-11-01 NOTE — MAU Note (Signed)
Pt presents with complaints of pain in her right lower side. She states that she was evaluated at [facility] last night and was told that she may be having a miscarriage. States some mild spotting.
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
## 2012-11-02 LAB — RH IG WORKUP (INCLUDES ABO/RH)
Antibody Screen: NEGATIVE
Gestational Age(Wks): 4

## 2012-12-25 ENCOUNTER — Other Ambulatory Visit: Payer: Self-pay

## 2012-12-25 ENCOUNTER — Encounter: Payer: Self-pay | Admitting: Family Medicine

## 2012-12-25 ENCOUNTER — Ambulatory Visit (INDEPENDENT_AMBULATORY_CARE_PROVIDER_SITE_OTHER): Payer: 59 | Admitting: Family Medicine

## 2012-12-25 VITALS — BP 128/78 | HR 97 | Temp 98.1°F | Resp 16 | Wt 251.0 lb

## 2012-12-25 DIAGNOSIS — B359 Dermatophytosis, unspecified: Secondary | ICD-10-CM

## 2012-12-25 MED ORDER — CLOTRIMAZOLE-BETAMETHASONE 1-0.05 % EX CREA: 1.0000 "application " | TOPICAL_CREAM | Freq: Two times a day (BID) | CUTANEOUS | Status: DC

## 2012-12-25 NOTE — Progress Notes (Signed)
  Subjective:    Patient ID: Sandy Perez, female    DOB: [birth year], 22 y.o.   MRN: [id number]  HPI Skin lesion- L lateral thigh.  'perfectly round'.  1st noticed 1 month ago.  Very itchy.  + pet exposure.   Review of Systems For ROS see HPI     Objective:   Physical Exam  Vitals reviewed. Constitutional: She appears well-developed and well-nourished. No distress.  Skin: Skin is warm and dry. There is erythema (round papule on L lateral/anterior thigh ~2.5 cm, consistent w/ ringworm).          Assessment & Plan:

## 2012-12-25 NOTE — Assessment & Plan Note (Signed)
New.  Start topical Lotrisone.  Reviewed supportive care and red flags that should prompt return.  Pt expressed understanding and is in agreement w/ plan.

## 2012-12-25 NOTE — Patient Instructions (Signed)
Follow up as needed Start the Lotrisone twice daily for the ringworm Go to your follow up w/ Dr Talmage Nap- if no changes to the plan, we can prescribe the metformin Call with any questions or concerns Happy Belated Birthday!!!

## 2012-12-29 ENCOUNTER — Other Ambulatory Visit: Payer: Self-pay | Admitting: Family Medicine

## 2012-12-29 NOTE — Telephone Encounter (Signed)
Med filled.  

## 2013-02-02 ENCOUNTER — Ambulatory Visit: Payer: 59 | Admitting: Family Medicine

## 2013-02-02 ENCOUNTER — Encounter: Payer: Self-pay | Admitting: Family Medicine

## 2013-02-02 ENCOUNTER — Ambulatory Visit (INDEPENDENT_AMBULATORY_CARE_PROVIDER_SITE_OTHER): Payer: 59 | Admitting: Family Medicine

## 2013-02-02 VITALS — BP 126/84 | HR 89 | Temp 98.3°F | Resp 16 | Wt 257.1 lb

## 2013-02-02 DIAGNOSIS — N912 Amenorrhea, unspecified: Secondary | ICD-10-CM

## 2013-02-02 DIAGNOSIS — R110 Nausea: Secondary | ICD-10-CM

## 2013-02-02 LAB — HCG, QUANTITATIVE, PREGNANCY: hCG, Beta Chain, Quant, S: 0.54 m[IU]/mL

## 2013-02-02 LAB — BASIC METABOLIC PANEL
BUN: 8 mg/dL (ref 6–23)
CO2: 27 mEq/L (ref 19–32)
GFR: 120.01 mL/min (ref >60.00)
Potassium: 3.9 mEq/L (ref 3.5–5.1)
Sodium: 134 mEq/L — ABNORMAL LOW (ref 135–145)

## 2013-02-02 LAB — CBC WITH DIFFERENTIAL/PLATELET
Basophils Relative: 0.5 % (ref 0.0–3.0)
Eosinophils Absolute: 0.1 10*3/uL (ref 0.0–0.7)
MCHC: 33.9 g/dL (ref 30.0–36.0)
MCV: 83.9 fl (ref 78.0–100.0)
Monocytes Absolute: 0.8 10*3/uL (ref 0.1–1.0)
Monocytes Relative: 8.2 % (ref 3.0–12.0)
Neutrophils Relative %: 45.4 % (ref 43.0–77.0)
Platelets: 292 10*3/uL (ref 150.0–400.0)
RDW: 13.6 % (ref 11.5–14.6)

## 2013-02-02 LAB — HEPATIC FUNCTION PANEL
AST: 22 U/L (ref 0–37)
Total Bilirubin: 0.3 mg/dL (ref 0.3–1.2)
Total Protein: 7.5 g/dL (ref 6.0–8.3)

## 2013-02-02 LAB — H. PYLORI ANTIBODY, IGG: H Pylori IgG: NEGATIVE

## 2013-02-02 MED ORDER — ONDANSETRON 4 MG PO TBDP: 4.0000 mg | ORAL_TABLET | Freq: Three times a day (TID) | ORAL | Status: DC | PRN

## 2013-02-02 NOTE — Assessment & Plan Note (Signed)
Upreg negative today.  Check HCG.  Encouraged pt to keep trying.

## 2013-02-02 NOTE — Progress Notes (Signed)
   Subjective:    Patient ID: Llana Aliment, female    DOB: [birth year], 22 y.o.   MRN: [id number]  HPI Nausea- sxs started 10 days ago.  Has been trying 'really really hard' to get pregnant.  No fevers.  No vomiting.  No diarrhea.  Nausea worsens w/ eating.  No contacts w/ similar.  Some increased GERD- breaking through current Omeprazole.  Some pelvic cramping but no abd pain.   Review of Systems For ROS see HPI     Objective:   Physical Exam  Vitals reviewed. Constitutional: She is oriented to person, place, and time. She appears well-developed and well-nourished. No distress.  HENT:  Head: Normocephalic and atraumatic.  Cardiovascular: Normal rate, regular rhythm and normal heart sounds.   Pulmonary/Chest: Effort normal and breath sounds normal. No respiratory distress. She has no wheezes. She has no rales.  Abdominal: Soft. Bowel sounds are normal. She exhibits no distension. There is no tenderness. There is no rebound and no guarding.  Neurological: She is alert and oriented to person, place, and time.  Skin: Skin is warm and dry.  Psychiatric: She has a normal mood and affect. Her behavior is normal.          Assessment & Plan:

## 2013-02-02 NOTE — Assessment & Plan Note (Signed)
New.  Upreg negative.  Check labs to r/o other underlying causes.  Start Zofran prn.  Reviewed supportive care and red flags that should prompt return.  Pt expressed understanding and is in agreement w/ plan.

## 2013-02-02 NOTE — Patient Instructions (Signed)
Follow up as needed We'll notify you of your lab results and make any changes if needed Start the Zofran as needed for nausea Call with any questions or concerns Hang in there! Happy Holidays!

## 2013-02-02 NOTE — Progress Notes (Signed)
Pre visit review using our clinic review tool, if applicable. No additional management support is needed unless otherwise documented below in the visit note. 

## 2013-04-13 ENCOUNTER — Telehealth: Payer: Self-pay | Admitting: *Deleted

## 2013-04-13 ENCOUNTER — Telehealth: Payer: Self-pay | Admitting: Family Medicine

## 2013-04-13 NOTE — Telephone Encounter (Addendum)
Patient states that she has been experiencing exacerbation of depression/anxiety.  Under a lot of stress, just recently moved into own place, mounting bills, and just recently lost baby.   With anxiety attacks patient states that she experiences chest tightness, tingling in fingers and "feels as if lungs are about to collapse."  She also has a "nervous itch".  Currently experiencing shortness of breath.  When she feels short of breath and feels as if she cannot catch her breath, she typically uses her Ventolin inhaler.  She states that typically the inhaler is beneficial, however she has ran out of her inhaler and it currently unable to pay for it.   Despite c/o SOB, patient is currently not in distress.  Denies chest pain and dizziness.  RR-14, 02 sat: 98% RA, P- 90. Patient was given a sample of Pro-Air, an appt was scheduled with Dr. Beverely Lowabori for Wednesday, 04/15/13 @ 2:45pm to discuss symptoms and to adjust medications, tips to help with panic attacks were given, and she was advised to seek counseling therapy.  She was also advised that if her symptoms worsen to go to the emergency room.  Patient stated understanding and no further concerns were voiced.     Dr. Beverely Lowabori is aware of the above.

## 2013-04-13 NOTE — Telephone Encounter (Signed)
Pt called requesting an sample or rx for a inhaler, states called earlier but nobody got back to her. Pt is experiencing some SOB and anxiety would like a call back. Please advise.

## 2013-04-13 NOTE — Telephone Encounter (Signed)
Patient called requesting a sample of her inhaler. She states she cannot afford the prescription for another couple of weeks. Please advise. CB# [phone]

## 2013-04-15 ENCOUNTER — Ambulatory Visit (INDEPENDENT_AMBULATORY_CARE_PROVIDER_SITE_OTHER): Payer: 59 | Admitting: Family Medicine

## 2013-04-15 ENCOUNTER — Encounter: Payer: Self-pay | Admitting: Family Medicine

## 2013-04-15 VITALS — BP 126/82 | HR 101 | Temp 98.4°F | Resp 16 | Wt 260.1 lb

## 2013-04-15 DIAGNOSIS — F341 Dysthymic disorder: Secondary | ICD-10-CM

## 2013-04-15 DIAGNOSIS — F419 Anxiety disorder, unspecified: Principal | ICD-10-CM

## 2013-04-15 DIAGNOSIS — F329 Major depressive disorder, single episode, unspecified: Secondary | ICD-10-CM

## 2013-04-15 MED ORDER — ALPRAZOLAM 0.5 MG PO TABS: 0.5000 mg | ORAL_TABLET | Freq: Two times a day (BID) | ORAL | Status: DC | PRN

## 2013-04-15 MED ORDER — ARIPIPRAZOLE 5 MG PO TABS: 5.0000 mg | ORAL_TABLET | Freq: Every day | ORAL | Status: DC

## 2013-04-15 NOTE — Assessment & Plan Note (Signed)
Deteriorated.  Pt having suicidal thoughts- none today and able to contract for safety.  Wellbutrin added to allergy list due to suicidal thoughts.  Pt doesn't feel Cymbalta is effective at this time.  Will add Abilify and monitor closely for improvement.  Also recommended pt call and schedule w/ counselor and psychiatrist.  Crisis #s given.  Will follow closely.

## 2013-04-15 NOTE — Telephone Encounter (Signed)
Sample has been provided. JG//CMA

## 2013-04-15 NOTE — Progress Notes (Signed)
Pre visit review using our clinic review tool, if applicable. No additional management support is needed unless otherwise documented below in the visit note. 

## 2013-04-15 NOTE — Progress Notes (Signed)
   Subjective:    Patient ID: Sandy Perez, female    DOB: 1990-03-18, 23 y.o.   MRN: [id number]  HPI Depression- pt feels sxs are worsening.  Has had suicidal thoughts since starting the Wellbutrin (November) and after miscarriage (4 months ago).  Currently denies suicidal thoughts.  Stopped Wellbutrin 1 month ago.  Able to contract for safety.  Staying w/ mom.  Not currently in counseling.  Doesn't feel Cymbalta is working.  Increased fatigue, 'no motivation', crying frequently.  + panic attacks- 'getting even worse', occuring more frequently and taking pt longer to calm down.  No current chest pain or SOB but this is common for pt.   Review of Systems For ROS see HPI     Objective:   Physical Exam  Vitals reviewed. Constitutional: She is oriented to person, place, and time. She appears well-developed and well-nourished.  HENT:  Head: Normocephalic and atraumatic.  Neck: Normal range of motion. Neck supple. No thyromegaly present.  Cardiovascular: Normal rate, regular rhythm, normal heart sounds and intact distal pulses.   Pulmonary/Chest: Effort normal and breath sounds normal. No respiratory distress. She has no wheezes. She has no rales.  Musculoskeletal: She exhibits no edema.  Lymphadenopathy:    She has no cervical adenopathy.  Neurological: She is alert and oriented to person, place, and time.  Psychiatric: Thought content normal.  Flat affect          Assessment & Plan:

## 2013-04-15 NOTE — Patient Instructions (Signed)
Follow up in 2 weeks to recheck mood Continue the Cymbalta Start the Abilify daily Call and schedule an appt with both psychiatry and counseling Please call the Crisis # if you again have thoughts of hurting yourself Do NOT get pregnant while on the Abilify- please use condoms Call with any questions or concerns Hang in there!

## 2013-04-29 ENCOUNTER — Ambulatory Visit (INDEPENDENT_AMBULATORY_CARE_PROVIDER_SITE_OTHER): Payer: 59 | Admitting: Family Medicine

## 2013-04-29 ENCOUNTER — Encounter: Payer: Self-pay | Admitting: Family Medicine

## 2013-04-29 VITALS — BP 124/80 | HR 86 | Temp 98.2°F | Resp 16 | Wt 257.1 lb

## 2013-04-29 DIAGNOSIS — F329 Major depressive disorder, single episode, unspecified: Secondary | ICD-10-CM

## 2013-04-29 DIAGNOSIS — F419 Anxiety disorder, unspecified: Principal | ICD-10-CM

## 2013-04-29 DIAGNOSIS — F341 Dysthymic disorder: Secondary | ICD-10-CM

## 2013-04-29 DIAGNOSIS — F32A Depression, unspecified: Secondary | ICD-10-CM

## 2013-04-29 MED ORDER — L-METHYLFOLATE 15 MG PO TABS: 1.0000 | ORAL_TABLET | Freq: Every day | ORAL | Status: DC

## 2013-04-29 NOTE — Assessment & Plan Note (Signed)
Improved since adding xanax prn.  Pt didn't feel Abilify was helpful or cost effective.  Will try adding Deplin as pt prefers to take a more natural route at this time.  Also strongly encouraged her to try counseling- names and #s provided.

## 2013-04-29 NOTE — Progress Notes (Signed)
Pre visit review using our clinic review tool, if applicable. No additional management support is needed unless otherwise documented below in the visit note. 

## 2013-04-29 NOTE — Patient Instructions (Signed)
Follow up in 6 weeks to recheck mood STOP the Abilify Continue the Cymbalta daily, add the Deplin daily Call and schedule an appt w/ a counselor- I think that will help! Call with any questions or concerns Hang in there!

## 2013-04-29 NOTE — Progress Notes (Signed)
   Subjective:    Patient ID: Sandy Perez, female    DOB: 17-Jul-1990, [age] y.o.   MRN: [id number]  HPI Anxiety and depression- pt feels that the mood is better.  Feels the xanax is helpful but doesn't feel the Abilify added any improvement and can't afford the copay.  Still on Cymbalta.  Mom takes Deplin 15 and pt is wondering if this would be helpful.  Sleeping better.  Less sad and tearful.  Not seeing counselor.  Is considering going to [facility] [place].     Review of Systems For ROS see HPI     Objective:   Physical Exam  Vitals reviewed. Constitutional: She is oriented to person, place, and time. She appears well-developed and well-nourished. No distress.  Neurological: She is alert and oriented to person, place, and time.  Skin: Skin is warm and dry.  Psychiatric: She has a normal mood and affect. Her behavior is normal. Thought content normal.          Assessment & Plan:

## 2013-05-15 ENCOUNTER — Other Ambulatory Visit: Payer: Self-pay | Admitting: Family Medicine

## 2013-05-15 NOTE — Telephone Encounter (Signed)
Med filled.  

## 2013-06-10 ENCOUNTER — Other Ambulatory Visit: Payer: Self-pay | Admitting: Family Medicine

## 2013-06-10 NOTE — Telephone Encounter (Signed)
Med filled.  

## 2013-06-11 ENCOUNTER — Telehealth: Payer: Self-pay | Admitting: General Practice

## 2013-06-11 MED ORDER — ALPRAZOLAM 0.5 MG PO TABS: 0.5000 mg | ORAL_TABLET | Freq: Two times a day (BID) | ORAL | Status: DC | PRN

## 2013-06-11 NOTE — Telephone Encounter (Signed)
Last OV 04-29-13 Alprazolam filled 04-15-13 #30 with 1  NO CSC or UDS

## 2013-06-11 NOTE — Telephone Encounter (Signed)
Rx printed and awaiting signature.   

## 2013-06-11 NOTE — Telephone Encounter (Signed)
Detailed message left advising Rx ready for pick up.     KP 

## 2013-06-11 NOTE — Telephone Encounter (Signed)
Ok for #30 but needs both controlled substance agreement and UDS

## 2013-06-30 ENCOUNTER — Other Ambulatory Visit: Payer: Self-pay | Admitting: Family Medicine

## 2013-07-01 NOTE — Telephone Encounter (Signed)
Med filled.  

## 2013-07-10 ENCOUNTER — Other Ambulatory Visit: Payer: Self-pay | Admitting: Family Medicine

## 2013-07-10 NOTE — Telephone Encounter (Signed)
Last OV 04-29-13 Med filled 06-11-13 #30 with 0

## 2013-07-10 NOTE — Telephone Encounter (Signed)
Med filled and faxed.  

## 2013-08-07 ENCOUNTER — Telehealth: Payer: Self-pay

## 2013-08-07 NOTE — Telephone Encounter (Signed)
Mother called and stated that patient attempted suicide. States that she used a medication that was Rx'd by her dentist. She is currently in [facility] and could possibly be discharged today, but is unsure. Mother wanted to schedule patient an appt with PCP but she also states that the patient's former provider in [place] has agreed to see her. Advised that the hospital will discharge her with recommendations and hopefully a BH referral. States that she is unsure of any details at this time but just wants to be sure that her daughter gets what she needs. Advised mother to let us know if we can be of help but until be know what the discharge recommendations are it would be difficult to schedule. Again stated that the former PCP in [place] "agreed" to see her. Patient is still admitted [facility]. Mother is on patient's DPR.

## 2013-08-07 NOTE — Telephone Encounter (Signed)
The family will need to determine if they want to follow up w/ me or former PCP in [place].  Regardless of who they see for primary care, she needs psychiatric treatment after a suicide attempt.  Psychiatrists can be difficult to get in to see- mom should be calling them to schedule an appt ASAP.  I frequently refer to [facility], Dr Evelene CroonKaur, Dr Nolen MuMcKinney- or she can select one that she is comfortable with.

## 2013-08-10 NOTE — Telephone Encounter (Signed)
Spoke with mother, Sandy FerrisDeborah Perez, who says that their [place]CP in [place] has agreed to see the patient and they plan to follow up with him instead of Dr. Beverely Lowabori.  Mother also shares that the patient has an appointment at the [facility] in [place], [place] and will be following up with them for psychiatric treatment.

## 2013-08-28 ENCOUNTER — Telehealth: Payer: Self-pay | Admitting: *Deleted

## 2013-08-28 NOTE — Telephone Encounter (Signed)
Called and spoke with the pt regarding a note she left about needing labs regarding her liver,and scans.  Asked the pt what she is needing.  Pt stated that she went to see her old primary doctor and he took blood and told her the liver test was elevated.  So she wanted to see her labs and scans that we have done.  Asked the pt if she was actived on MyChart, and she stated yes.  So she asked if she could view her results on MyChart.  Informed the pt yes she could review all her results on MyChart.  Pt understood and agreed to look at her results on MyChart.//AB/CMA

## 2013-09-14 ENCOUNTER — Ambulatory Visit: Payer: 59 | Admitting: Family Medicine

## 2013-11-10 LAB — OB RESULTS CONSOLE GC/CHLAMYDIA
Chlamydia: NEGATIVE
GC PROBE AMP, GENITAL: NEGATIVE

## 2013-11-10 LAB — OB RESULTS CONSOLE RUBELLA ANTIBODY, IGM: RUBELLA: IMMUNE

## 2013-11-10 LAB — OB RESULTS CONSOLE RPR: RPR: NONREACTIVE

## 2013-11-10 LAB — OB RESULTS CONSOLE HEPATITIS B SURFACE ANTIGEN: Hepatitis B Surface Ag: NEGATIVE

## 2013-11-10 LAB — OB RESULTS CONSOLE HIV ANTIBODY (ROUTINE TESTING): HIV: NONREACTIVE

## 2013-11-26 ENCOUNTER — Other Ambulatory Visit: Payer: Self-pay | Admitting: Obstetrics & Gynecology

## 2013-11-26 ENCOUNTER — Other Ambulatory Visit (HOSPITAL_COMMUNITY)
Admission: RE | Admit: 2013-11-26 | Discharge: 2013-11-26 | Disposition: A | Discharge status: Home / Self Care | Payer: 59 | Source: Ambulatory Visit | Attending: Obstetrics & Gynecology | Admitting: Obstetrics & Gynecology

## 2013-11-26 DIAGNOSIS — Z113 Encounter for screening for infections with a predominantly sexual mode of transmission: Secondary | ICD-10-CM | POA: Diagnosis present

## 2013-11-26 DIAGNOSIS — Z01419 Encounter for gynecological examination (general) (routine) without abnormal findings: Secondary | ICD-10-CM | POA: Diagnosis present

## 2013-11-27 LAB — CYTOLOGY - PAP

## 2013-12-11 ENCOUNTER — Other Ambulatory Visit: Payer: Self-pay | Admitting: Family Medicine

## 2013-12-11 NOTE — Telephone Encounter (Signed)
Med filled.  

## 2013-12-21 ENCOUNTER — Encounter: Payer: Self-pay | Admitting: Family Medicine

## 2014-02-19 NOTE — L&D Delivery Note (Signed)
Delivery Note At 9:26 PM a viable female was delivered via Vaginal, Spontaneous Delivery (Presentation:oa ;  ).  APGAR:8/9 , ; weight  .   Placenta status: Intact, Spontaneous. 3 vessel Cord:  with the following complications:none .  Cord pH: na  Anesthesia: Epidural  Episiotomy: none  Lacerations:  first Suture Repair: 2.0 chromic Est. Blood Loss (mL):  350  Mom to postpartum.  Baby to Couplet care / Skin to Skin.  Toleen Lachapelle S 06/06/2014, 9:35 PM

## 2014-03-04 ENCOUNTER — Encounter (HOSPITAL_COMMUNITY): Payer: Self-pay | Admitting: Emergency Medicine

## 2014-03-04 ENCOUNTER — Emergency Department (INDEPENDENT_AMBULATORY_CARE_PROVIDER_SITE_OTHER)
Admission: EM | Admit: 2014-03-04 | Discharge: 2014-03-04 | Disposition: A | Discharge status: Home / Self Care | Payer: 59 | Source: Home / Self Care | Attending: Family Medicine | Admitting: Family Medicine

## 2014-03-04 DIAGNOSIS — J111 Influenza due to unidentified influenza virus with other respiratory manifestations: Secondary | ICD-10-CM

## 2014-03-04 DIAGNOSIS — R69 Illness, unspecified: Principal | ICD-10-CM

## 2014-03-04 LAB — POCT RAPID STREP A: STREPTOCOCCUS, GROUP A SCREEN (DIRECT): NEGATIVE

## 2014-03-04 LAB — POCT URINALYSIS DIP (DEVICE)
Glucose, UA: NEGATIVE mg/dL
KETONES UR: 80 mg/dL — AB
Nitrite: NEGATIVE
Protein, ur: 100 mg/dL — AB
Specific Gravity, Urine: 1.02 (ref 1.005–1.030)
Urobilinogen, UA: 0.2 mg/dL (ref 0.0–1.0)
pH: 7.5 (ref 5.0–8.0)

## 2014-03-04 LAB — POCT PREGNANCY, URINE: PREG TEST UR: POSITIVE — AB

## 2014-03-04 MED ORDER — ACETAMINOPHEN 325 MG PO TABS
ORAL_TABLET | ORAL | Status: AC
  Filled 2014-03-04: qty 3

## 2014-03-04 MED ORDER — ACETAMINOPHEN 500 MG PO TABS: 1000.0000 mg | ORAL_TABLET | Freq: Once | ORAL | Status: DC

## 2014-03-04 MED ORDER — OSELTAMIVIR PHOSPHATE 75 MG PO CAPS: 75.0000 mg | ORAL_CAPSULE | Freq: Two times a day (BID) | ORAL | Status: DC

## 2014-03-04 MED ORDER — SODIUM CHLORIDE 0.9 % IV BOLUS (SEPSIS)
1000.0000 mL | Freq: Once | INTRAVENOUS | Status: AC
  Administered 2014-03-04: 1000 mL via INTRAVENOUS

## 2014-03-04 MED ORDER — ACETAMINOPHEN 325 MG PO TABS
975.0000 mg | ORAL_TABLET | Freq: Once | ORAL | Status: AC
  Administered 2014-03-04: 975 mg via ORAL

## 2014-03-04 NOTE — ED Notes (Signed)
Pt states that she has been having a sore throat since this morning along with ear pain for 2 weeks.
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
## 2014-03-04 NOTE — Discharge Instructions (Signed)

## 2014-03-06 ENCOUNTER — Encounter (HOSPITAL_COMMUNITY): Payer: Self-pay | Admitting: *Deleted

## 2014-03-06 ENCOUNTER — Emergency Department (INDEPENDENT_AMBULATORY_CARE_PROVIDER_SITE_OTHER)
Admission: EM | Admit: 2014-03-06 | Discharge: 2014-03-06 | Disposition: A | Discharge status: Home / Self Care | Payer: 59 | Source: Home / Self Care | Attending: Emergency Medicine | Admitting: Emergency Medicine

## 2014-03-06 DIAGNOSIS — B9789 Other viral agents as the cause of diseases classified elsewhere: Principal | ICD-10-CM

## 2014-03-06 DIAGNOSIS — J069 Acute upper respiratory infection, unspecified: Secondary | ICD-10-CM

## 2014-03-06 LAB — URINE CULTURE: Colony Count: >=100000

## 2014-03-06 LAB — CULTURE, GROUP A STREP

## 2014-03-06 MED ORDER — BUDESONIDE 32 MCG/ACT NA SUSP: 2.0000 | Freq: Every day | NASAL | Status: DC

## 2014-03-06 MED ORDER — AMOXICILLIN 875 MG PO TABS: 875.0000 mg | ORAL_TABLET | Freq: Two times a day (BID) | ORAL | Status: DC

## 2014-03-06 MED ORDER — PSEUDOEPHEDRINE-ACETAMINOPHEN 30-325 MG PO TABS: ORAL_TABLET | ORAL | Status: DC
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
## 2014-03-06 NOTE — Discharge Instructions (Signed)
Upper Respiratory Infection, Adult An upper respiratory infection (URI) is also sometimes known as the common cold. The upper respiratory tract includes the nose, sinuses, throat, trachea, and bronchi. Bronchi are the airways leading to the lungs. Most people improve within 1 week, but symptoms can last up to 2 weeks. A residual cough may last even longer.  CAUSES Many different viruses can infect the tissues lining the upper respiratory tract. The tissues become irritated and inflamed and often become very moist. Mucus production is also common. A cold is contagious. You can easily spread the virus to others by oral contact. This includes kissing, sharing a glass, coughing, or sneezing. Touching your mouth or nose and then touching a surface, which is then touched by another person, can also spread the virus. SYMPTOMS  Symptoms typically develop 1 to 3 days after you come in contact with a cold virus. Symptoms vary from person to person. They may include:  Runny nose.  Sneezing.  Nasal congestion.  Sinus irritation.  Sore throat.  Loss of voice (laryngitis).  Cough.  Fatigue.  Muscle aches.  Loss of appetite.  Headache.  Low-grade fever. DIAGNOSIS  You might diagnose your own cold based on familiar symptoms, since most people get a cold 2 to 3 times a year. Your caregiver can confirm this based on your exam. Most importantly, your caregiver can check that your symptoms are not due to another disease such as strep throat, sinusitis, pneumonia, asthma, or epiglottitis. Blood tests, throat tests, and X-rays are not necessary to diagnose a common cold, but they may sometimes be helpful in excluding other more serious diseases. Your caregiver will decide if any further tests are required. RISKS AND COMPLICATIONS  You may be at risk for a more severe case of the common cold if you smoke cigarettes, have chronic heart disease (such as heart failure) or lung disease (such as asthma), or if  you have a weakened immune system. The very young and very old are also at risk for more serious infections. Bacterial sinusitis, middle ear infections, and bacterial pneumonia can complicate the common cold. The common cold can worsen asthma and chronic obstructive pulmonary disease (COPD). Sometimes, these complications can require emergency medical care and may be life-threatening. PREVENTION  The best way to protect against getting a cold is to practice good hygiene. Avoid oral or hand contact with people with cold symptoms. Wash your hands often if contact occurs. There is no clear evidence that vitamin C, vitamin E, echinacea, or exercise reduces the chance of developing a cold. However, it is always recommended to get plenty of rest and practice good nutrition. TREATMENT  Treatment is directed at relieving symptoms. There is no cure. Antibiotics are not effective, because the infection is caused by a virus, not by bacteria. Treatment may include:  Increased fluid intake. Sports drinks offer valuable electrolytes, sugars, and fluids.  Breathing heated mist or steam (vaporizer or shower).  Eating chicken soup or other clear broths, and maintaining good nutrition.  Getting plenty of rest.  Using gargles or lozenges for comfort.  Controlling fevers with ibuprofen or acetaminophen as directed by your caregiver.  Increasing usage of your inhaler if you have asthma. Zinc gel and zinc lozenges, taken in the first 24 hours of the common cold, can shorten the duration and lessen the severity of symptoms. Pain medicines may help with fever, muscle aches, and throat pain. A variety of non-prescription medicines are available to treat congestion and runny nose. Your caregiver   can make recommendations and may suggest nasal or lung inhalers for other symptoms.  HOME CARE INSTRUCTIONS   Only take over-the-counter or prescription medicines for pain, discomfort, or fever as directed by your  caregiver.  Use a warm mist humidifier or inhale steam from a shower to increase air moisture. This may keep secretions moist and make it easier to breathe.  Drink enough water and fluids to keep your urine clear or pale yellow.  Rest as needed.  Return to work when your temperature has returned to normal or as your caregiver advises. You may need to stay home longer to avoid infecting others. You can also use a face mask and careful hand washing to prevent spread of the virus. SEEK MEDICAL CARE IF:   After the first few days, you feel you are getting worse rather than better.  You need your caregiver's advice about medicines to control symptoms.  You develop chills, worsening shortness of breath, or brown or red sputum. These may be signs of pneumonia.  You develop yellow or brown nasal discharge or pain in the face, especially when you bend forward. These may be signs of sinusitis.  You develop a fever, swollen neck glands, pain with swallowing, or white areas in the back of your throat. These may be signs of strep throat. SEEK IMMEDIATE MEDICAL CARE IF:   You have a fever.  You develop severe or persistent headache, ear pain, sinus pain, or chest pain.  You develop wheezing, a prolonged cough, cough up blood, or have a change in your usual mucus (if you have chronic lung disease).  You develop sore muscles or a stiff neck. Document Released: 08/01/2000 Document Revised: 04/30/2011 Document Reviewed: 05/13/2013 Exit[employer] Patient Information 2015 [employer]. This information is not intended to replace advice given to you by your health care provider. Make sure you discuss any questions you have with your health care provider.  

## 2014-03-06 NOTE — ED Notes (Addendum)
Symptoms are not as severe but has a lot of pressure in lower sinuses.  Starting to cough- non productive.  No fever. C/o headache.

## 2014-06-03 ENCOUNTER — Inpatient Hospital Stay (HOSPITAL_COMMUNITY)
Admission: AD | Admit: 2014-06-03 | Discharge: 2014-06-08 | DRG: 774 | Disposition: A | Discharge status: Home / Self Care | Payer: 59 | Source: Ambulatory Visit | Attending: Obstetrics and Gynecology | Admitting: Obstetrics and Gynecology

## 2014-06-03 ENCOUNTER — Encounter (HOSPITAL_COMMUNITY): Payer: Self-pay | Admitting: *Deleted

## 2014-06-03 ENCOUNTER — Observation Stay (HOSPITAL_COMMUNITY): Payer: 59

## 2014-06-03 DIAGNOSIS — Z3490 Encounter for supervision of normal pregnancy, unspecified, unspecified trimester: Secondary | ICD-10-CM

## 2014-06-03 DIAGNOSIS — E282 Polycystic ovarian syndrome: Secondary | ICD-10-CM | POA: Diagnosis present

## 2014-06-03 DIAGNOSIS — O1490 Unspecified pre-eclampsia, unspecified trimester: Secondary | ICD-10-CM | POA: Diagnosis present

## 2014-06-03 DIAGNOSIS — O133 Gestational [pregnancy-induced] hypertension without significant proteinuria, third trimester: Secondary | ICD-10-CM | POA: Diagnosis not present

## 2014-06-03 DIAGNOSIS — O1493 Unspecified pre-eclampsia, third trimester: Secondary | ICD-10-CM | POA: Diagnosis not present

## 2014-06-03 DIAGNOSIS — Z91410 Personal history of adult physical and sexual abuse: Secondary | ICD-10-CM

## 2014-06-03 DIAGNOSIS — O99344 Other mental disorders complicating childbirth: Secondary | ICD-10-CM | POA: Diagnosis present

## 2014-06-03 DIAGNOSIS — O3483 Maternal care for other abnormalities of pelvic organs, third trimester: Secondary | ICD-10-CM | POA: Diagnosis present

## 2014-06-03 DIAGNOSIS — O99354 Diseases of the nervous system complicating childbirth: Secondary | ICD-10-CM | POA: Diagnosis present

## 2014-06-03 DIAGNOSIS — Z3A36 36 weeks gestation of pregnancy: Secondary | ICD-10-CM | POA: Diagnosis not present

## 2014-06-03 DIAGNOSIS — F419 Anxiety disorder, unspecified: Secondary | ICD-10-CM | POA: Diagnosis present

## 2014-06-03 DIAGNOSIS — G43909 Migraine, unspecified, not intractable, without status migrainosus: Secondary | ICD-10-CM | POA: Diagnosis present

## 2014-06-03 DIAGNOSIS — F329 Major depressive disorder, single episode, unspecified: Secondary | ICD-10-CM | POA: Diagnosis present

## 2014-06-03 HISTORY — DX: Partial loss of teeth, unspecified cause, unspecified class: K08.409

## 2014-06-03 HISTORY — DX: Acquired absence of other organs: Z90.89

## 2014-06-03 HISTORY — DX: Personal history of self-harm: Z91.5

## 2014-06-03 HISTORY — DX: Personal history of suicidal behavior: Z91.51

## 2014-06-03 LAB — CBC WITH DIFFERENTIAL/PLATELET
BASOS ABS: 0 10*3/uL (ref 0.0–0.1)
Basophils Relative: 0 % (ref 0–1)
Eosinophils Absolute: 0.1 10*3/uL (ref 0.0–0.7)
Eosinophils Relative: 1 % (ref 0–5)
HCT: 33.9 % — ABNORMAL LOW (ref 36.0–46.0)
Hemoglobin: 11.9 g/dL — ABNORMAL LOW (ref 12.0–15.0)
LYMPHS ABS: 2.1 10*3/uL (ref 0.7–4.0)
Lymphocytes Relative: 21 % (ref 12–46)
MCH: 30.1 pg (ref 26.0–34.0)
MCHC: 35.1 g/dL (ref 30.0–36.0)
MCV: 85.8 fL (ref 78.0–100.0)
Monocytes Absolute: 1 10*3/uL (ref 0.1–1.0)
Monocytes Relative: 10 % (ref 3–12)
NEUTROS ABS: 6.9 10*3/uL (ref 1.7–7.7)
Neutrophils Relative %: 68 % (ref 43–77)
PLATELETS: 213 10*3/uL (ref 150–400)
RBC: 3.95 MIL/uL (ref 3.87–5.11)
RDW: 13.8 % (ref 11.5–15.5)
WBC: 10.1 10*3/uL (ref 4.0–10.5)

## 2014-06-03 LAB — URINE MICROSCOPIC-ADD ON

## 2014-06-03 LAB — COMPREHENSIVE METABOLIC PANEL
ALK PHOS: 114 U/L (ref 39–117)
ALT: 14 U/L (ref 0–35)
AST: 18 U/L (ref 0–37)
Albumin: 3 g/dL — ABNORMAL LOW (ref 3.5–5.2)
Anion gap: 9 (ref 5–15)
BUN: 7 mg/dL (ref 6–23)
CO2: 21 mmol/L (ref 19–32)
Calcium: 10.1 mg/dL (ref 8.4–10.5)
Chloride: 105 mmol/L (ref 96–112)
Creatinine, Ser: 0.53 mg/dL (ref 0.50–1.10)
GFR calc non Af Amer: >90 mL/min (ref >90)
Glucose, Bld: 95 mg/dL (ref 70–99)
POTASSIUM: 3.9 mmol/L (ref 3.5–5.1)
Sodium: 135 mmol/L (ref 135–145)
TOTAL PROTEIN: 6.9 g/dL (ref 6.0–8.3)
Total Bilirubin: 0.3 mg/dL (ref 0.3–1.2)

## 2014-06-03 LAB — PROTEIN / CREATININE RATIO, URINE
CREATININE, URINE: 264.53 mg/dL
PROTEIN CREATININE RATIO: 1.63 — AB (ref 0.00–0.15)
TOTAL PROTEIN, URINE: 430 mg/dL

## 2014-06-03 LAB — URIC ACID: Uric Acid, Serum: 6.6 mg/dL (ref 2.4–7.0)

## 2014-06-03 LAB — URINALYSIS, ROUTINE W REFLEX MICROSCOPIC
Bilirubin Urine: NEGATIVE
Glucose, UA: NEGATIVE mg/dL
KETONES UR: NEGATIVE mg/dL
Nitrite: NEGATIVE
Specific Gravity, Urine: >1.03 — ABNORMAL HIGH (ref 1.005–1.030)
Urobilinogen, UA: 0.2 mg/dL (ref 0.0–1.0)
pH: 6.5 (ref 5.0–8.0)

## 2014-06-03 LAB — OB RESULTS CONSOLE GBS: GBS: NEGATIVE

## 2014-06-03 LAB — TYPE AND SCREEN
ABO/RH(D): B NEG
Antibody Screen: NEGATIVE

## 2014-06-03 LAB — LACTATE DEHYDROGENASE: LDH: 90 U/L — ABNORMAL LOW (ref 94–250)

## 2014-06-03 MED ORDER — SODIUM CHLORIDE 0.9 % IV SOLN: 250.0000 mL | INTRAVENOUS | Status: DC | PRN

## 2014-06-03 MED ORDER — ACETAMINOPHEN 325 MG PO TABS
650.0000 mg | ORAL_TABLET | ORAL | Status: DC | PRN
  Administered 2014-06-04 – 2014-06-05 (×4): 650 mg via ORAL
  Filled 2014-06-03 (×4): qty 2

## 2014-06-03 MED ORDER — CALCIUM CARBONATE ANTACID 500 MG PO CHEW
2.0000 | CHEWABLE_TABLET | ORAL | Status: DC | PRN
  Administered 2014-06-04: 400 mg via ORAL
  Filled 2014-06-03 (×2): qty 1

## 2014-06-03 MED ORDER — PRENATAL MULTIVITAMIN CH
1.0000 | ORAL_TABLET | Freq: Every day | ORAL | Status: DC
Start: 2014-06-04 — End: 2014-06-07
  Administered 2014-06-04 – 2014-06-05 (×2): 1 via ORAL
  Filled 2014-06-03 (×2): qty 1

## 2014-06-03 MED ORDER — ZOLPIDEM TARTRATE 5 MG PO TABS: 5.0000 mg | ORAL_TABLET | Freq: Every evening | ORAL | Status: DC | PRN

## 2014-06-03 MED ORDER — SODIUM CHLORIDE 0.9 % IJ SOLN: 3.0000 mL | Freq: Two times a day (BID) | INTRAMUSCULAR | Status: DC

## 2014-06-03 MED ORDER — SODIUM CHLORIDE 0.9 % IJ SOLN: 3.0000 mL | INTRAMUSCULAR | Status: DC | PRN

## 2014-06-03 MED ORDER — DOCUSATE SODIUM 100 MG PO CAPS
100.0000 mg | ORAL_CAPSULE | Freq: Every day | ORAL | Status: DC
  Administered 2014-06-05: 100 mg via ORAL
  Filled 2014-06-03 (×2): qty 1

## 2014-06-03 NOTE — Plan of Care (Signed)
Problem: Consults Goal: Birthing Suites Patient Information Press F2 to bring up selections list   Pt < [redacted] weeks EGA     
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
## 2014-06-03 NOTE — MAU Note (Signed)
BP is new finding today. Slight HA, no other complaints.  Labs pending.  Was 2/50 on exam today.

## 2014-06-03 NOTE — MAU Note (Signed)
Pt had elevated b/p in office today. C/o dull headache. Good fetal movement reported.

## 2014-06-04 LAB — LACTATE DEHYDROGENASE: LDH: 90 U/L — AB (ref 94–250)

## 2014-06-04 LAB — COMPREHENSIVE METABOLIC PANEL
ALBUMIN: 2.6 g/dL — AB (ref 3.5–5.2)
ALT: 14 U/L (ref 0–35)
AST: 15 U/L (ref 0–37)
Alkaline Phosphatase: 108 U/L (ref 39–117)
Anion gap: 6 (ref 5–15)
BILIRUBIN TOTAL: 0.2 mg/dL — AB (ref 0.3–1.2)
BUN: 7 mg/dL (ref 6–23)
CHLORIDE: 107 mmol/L (ref 96–112)
CO2: 23 mmol/L (ref 19–32)
Calcium: 10.5 mg/dL (ref 8.4–10.5)
Creatinine, Ser: 0.55 mg/dL (ref 0.50–1.10)
GFR calc Af Amer: >90 mL/min (ref >90)
Glucose, Bld: 92 mg/dL (ref 70–99)
Potassium: 4.4 mmol/L (ref 3.5–5.1)
SODIUM: 136 mmol/L (ref 135–145)
Total Protein: 5.8 g/dL — ABNORMAL LOW (ref 6.0–8.3)

## 2014-06-04 LAB — CULTURE, OB URINE: Colony Count: 70000

## 2014-06-04 LAB — PROTEIN, URINE, 24 HOUR
COLLECTION INTERVAL-UPROT: 24 h
PROTEIN, URINE: 90 mg/dL — AB (ref 5–24)
Protein, 24H Urine: 1620 mg/d — ABNORMAL HIGH (ref <150)
Urine Total Volume-UPROT: 1800 mL

## 2014-06-04 LAB — CBC
HCT: 32.6 % — ABNORMAL LOW (ref 36.0–46.0)
Hemoglobin: 11.3 g/dL — ABNORMAL LOW (ref 12.0–15.0)
MCH: 30.1 pg (ref 26.0–34.0)
MCHC: 34.7 g/dL (ref 30.0–36.0)
MCV: 86.9 fL (ref 78.0–100.0)
PLATELETS: 208 10*3/uL (ref 150–400)
RBC: 3.75 MIL/uL — ABNORMAL LOW (ref 3.87–5.11)
RDW: 13.8 % (ref 11.5–15.5)
WBC: 9.7 10*3/uL (ref 4.0–10.5)

## 2014-06-04 LAB — URIC ACID: URIC ACID, SERUM: 7 mg/dL (ref 2.4–7.0)

## 2014-06-04 MED ORDER — FAMOTIDINE 20 MG PO TABS
20.0000 mg | ORAL_TABLET | Freq: Two times a day (BID) | ORAL | Status: DC
  Administered 2014-06-04 – 2014-06-05 (×4): 20 mg via ORAL
  Filled 2014-06-04 (×4): qty 1

## 2014-06-04 NOTE — Progress Notes (Signed)
I received a referral from pt's mother, Sandy Perez, who is a Engineer, civil (consulting)nurse in NICU.  Sandy Perez was in good spirits and reports having great support.  We spoke about her nervousness about delivery and whether baby would need support breathing at first.  We also spoke about her feelings around her new role as a mother.    Sandy Perez is aware of our availability throughout her time in the hospital. Please page as needs arise.  Centex CorporationChaplain Katy Ajooni Karam Pager, [phone] 11:28 AM

## 2014-06-04 NOTE — Progress Notes (Signed)
Patient ID: Sandy DawsonLaura J Stokes, female   DOB: [birth year], 24 y.o.   MRN: [id number] S: NO HEADACHE, SCOTOMATA OR RUQ PAIN O:  BP 132/83   MAX 157/108       DTR 2+       FHR REACTIVE YESTERDAY       BPP 8/8       PIH LABS WNL  24 HR URINE PENDING A:  IUP AT 36.5 WITH PRE-ECLAMPSIA P:  CHECK 24 HOUR URINE IF BP WORSENING DELIVERY OTHERWISE DELIVERY AT 37 WEEKS

## 2014-06-05 DIAGNOSIS — O99354 Diseases of the nervous system complicating childbirth: Secondary | ICD-10-CM | POA: Diagnosis present

## 2014-06-05 DIAGNOSIS — Z3490 Encounter for supervision of normal pregnancy, unspecified, unspecified trimester: Secondary | ICD-10-CM

## 2014-06-05 DIAGNOSIS — O99344 Other mental disorders complicating childbirth: Secondary | ICD-10-CM | POA: Diagnosis present

## 2014-06-05 DIAGNOSIS — O133 Gestational [pregnancy-induced] hypertension without significant proteinuria, third trimester: Secondary | ICD-10-CM | POA: Diagnosis present

## 2014-06-05 DIAGNOSIS — E282 Polycystic ovarian syndrome: Secondary | ICD-10-CM | POA: Diagnosis present

## 2014-06-05 DIAGNOSIS — O3483 Maternal care for other abnormalities of pelvic organs, third trimester: Secondary | ICD-10-CM | POA: Diagnosis present

## 2014-06-05 DIAGNOSIS — Z91410 Personal history of adult physical and sexual abuse: Secondary | ICD-10-CM | POA: Diagnosis not present

## 2014-06-05 DIAGNOSIS — G43909 Migraine, unspecified, not intractable, without status migrainosus: Secondary | ICD-10-CM | POA: Diagnosis present

## 2014-06-05 DIAGNOSIS — Z3A36 36 weeks gestation of pregnancy: Secondary | ICD-10-CM | POA: Diagnosis present

## 2014-06-05 DIAGNOSIS — F419 Anxiety disorder, unspecified: Secondary | ICD-10-CM | POA: Diagnosis present

## 2014-06-05 DIAGNOSIS — O1493 Unspecified pre-eclampsia, third trimester: Secondary | ICD-10-CM | POA: Diagnosis present

## 2014-06-05 DIAGNOSIS — F329 Major depressive disorder, single episode, unspecified: Secondary | ICD-10-CM | POA: Diagnosis present

## 2014-06-05 LAB — CULTURE, BETA STREP (GROUP B ONLY)

## 2014-06-05 MED ORDER — HYDRALAZINE HCL 20 MG/ML IJ SOLN
5.0000 mg | INTRAMUSCULAR | Status: AC | PRN
  Administered 2014-06-06: 5 mg via INTRAVENOUS
  Administered 2014-06-06: 10 mg via INTRAVENOUS
  Filled 2014-06-05 (×2): qty 1

## 2014-06-05 MED ORDER — FLEET ENEMA 7-19 GM/118ML RE ENEM: 1.0000 | ENEMA | RECTAL | Status: DC | PRN

## 2014-06-05 MED ORDER — PENICILLIN G POTASSIUM 5000000 UNITS IJ SOLR
2.5000 10*6.[IU] | INTRAVENOUS | Status: DC
  Filled 2014-06-05 (×8): qty 2.5

## 2014-06-05 MED ORDER — LACTATED RINGERS IV SOLN: 500.0000 mL | INTRAVENOUS | Status: DC | PRN

## 2014-06-05 MED ORDER — OXYTOCIN BOLUS FROM INFUSION: 500.0000 mL | INTRAVENOUS | Status: DC

## 2014-06-05 MED ORDER — DEXTROSE 5 % IV SOLN
5.0000 10*6.[IU] | Freq: Once | INTRAVENOUS | Status: DC
  Filled 2014-06-05: qty 5

## 2014-06-05 MED ORDER — OXYTOCIN 40 UNITS IN LACTATED RINGERS INFUSION - SIMPLE MED
62.5000 mL/h | INTRAVENOUS | Status: DC
  Filled 2014-06-05: qty 1000

## 2014-06-05 MED ORDER — OXYCODONE-ACETAMINOPHEN 5-325 MG PO TABS: 2.0000 | ORAL_TABLET | ORAL | Status: DC | PRN

## 2014-06-05 MED ORDER — CITRIC ACID-SODIUM CITRATE 334-500 MG/5ML PO SOLN
30.0000 mL | ORAL | Status: DC | PRN
  Filled 2014-06-05: qty 30

## 2014-06-05 MED ORDER — TERBUTALINE SULFATE 1 MG/ML IJ SOLN
0.2500 mg | Freq: Once | INTRAMUSCULAR | Status: AC | PRN
Start: 2014-06-05 — End: 2014-06-05

## 2014-06-05 MED ORDER — LACTATED RINGERS IV SOLN
INTRAVENOUS | Status: DC
  Administered 2014-06-05 – 2014-06-06 (×3): via INTRAVENOUS

## 2014-06-05 MED ORDER — OXYCODONE-ACETAMINOPHEN 5-325 MG PO TABS: 1.0000 | ORAL_TABLET | ORAL | Status: DC | PRN

## 2014-06-05 MED ORDER — ONDANSETRON HCL 4 MG/2ML IJ SOLN
4.0000 mg | Freq: Four times a day (QID) | INTRAMUSCULAR | Status: DC | PRN
  Administered 2014-06-06: 4 mg via INTRAVENOUS
  Filled 2014-06-05: qty 2

## 2014-06-05 MED ORDER — MISOPROSTOL 25 MCG QUARTER TABLET
25.0000 ug | ORAL_TABLET | ORAL | Status: DC
Start: 2014-06-05 — End: 2014-06-07
  Administered 2014-06-05 – 2014-06-06 (×3): 25 ug via VAGINAL
  Filled 2014-06-05: qty 1
  Filled 2014-06-05 (×2): qty 0.25
  Filled 2014-06-05: qty 1
  Filled 2014-06-05: qty 0.25
  Filled 2014-06-05 (×2): qty 1

## 2014-06-05 MED ORDER — LIDOCAINE HCL (PF) 1 % IJ SOLN
30.0000 mL | INTRAMUSCULAR | Status: DC | PRN
  Filled 2014-06-05: qty 30

## 2014-06-05 MED ORDER — ACETAMINOPHEN 325 MG PO TABS: 650.0000 mg | ORAL_TABLET | ORAL | Status: DC | PRN

## 2014-06-05 NOTE — Progress Notes (Signed)
UR completed 

## 2014-06-05 NOTE — Progress Notes (Signed)
Patient ID: Sandy DawsonLaura J Perez, female   DOB: [birth year], 24 y.o.   MRN: [id number] S: NO PIH SX'S O: 118/98 VSS       DTR 2+       FHR REACTIVE A: IUP AT 37.6 WITH PRE ECLAMPSIA P: CYTOTEC TONIGHT

## 2014-06-06 ENCOUNTER — Encounter (HOSPITAL_COMMUNITY): Payer: Self-pay | Admitting: Anesthesiology

## 2014-06-06 ENCOUNTER — Inpatient Hospital Stay (HOSPITAL_COMMUNITY): Payer: 59 | Admitting: Anesthesiology

## 2014-06-06 LAB — CBC
HCT: 31.4 % — ABNORMAL LOW (ref 36.0–46.0)
HEMATOCRIT: 33.8 % — AB (ref 36.0–46.0)
Hemoglobin: 11.7 g/dL — ABNORMAL LOW (ref 12.0–15.0)
Hemoglobin: 11.1 g/dL — ABNORMAL LOW (ref 12.0–15.0)
MCH: 29.8 pg (ref 26.0–34.0)
MCH: 30.2 pg (ref 26.0–34.0)
MCHC: 34.6 g/dL (ref 30.0–36.0)
MCHC: 35.4 g/dL (ref 30.0–36.0)
MCV: 86 fL (ref 78.0–100.0)
MCV: 85.3 fL (ref 78.0–100.0)
PLATELETS: 212 10*3/uL (ref 150–400)
Platelets: 203 10*3/uL (ref 150–400)
RBC: 3.93 MIL/uL (ref 3.87–5.11)
RBC: 3.68 MIL/uL — ABNORMAL LOW (ref 3.87–5.11)
RDW: 13.8 % (ref 11.5–15.5)
RDW: 13.8 % (ref 11.5–15.5)
WBC: 10.5 10*3/uL (ref 4.0–10.5)
WBC: 19.2 10*3/uL — ABNORMAL HIGH (ref 4.0–10.5)

## 2014-06-06 LAB — TYPE AND SCREEN
ABO/RH(D): B NEG
Antibody Screen: NEGATIVE

## 2014-06-06 LAB — OB RESULTS CONSOLE GBS: GBS: NEGATIVE

## 2014-06-06 LAB — RPR: RPR: NONREACTIVE

## 2014-06-06 MED ORDER — FENTANYL CITRATE (PF) 100 MCG/2ML IJ SOLN
INTRAMUSCULAR | Status: AC
  Filled 2014-06-06: qty 2

## 2014-06-06 MED ORDER — BUPIVACAINE HCL (PF) 0.75 % IJ SOLN
INTRAMUSCULAR | Status: AC
  Filled 2014-06-06 (×2): qty 8.3

## 2014-06-06 MED ORDER — PHENYLEPHRINE 40 MCG/ML (10ML) SYRINGE FOR IV PUSH (FOR BLOOD PRESSURE SUPPORT)
80.0000 ug | PREFILLED_SYRINGE | INTRAVENOUS | Status: DC | PRN
  Filled 2014-06-06: qty 2

## 2014-06-06 MED ORDER — EPHEDRINE 5 MG/ML INJ
10.0000 mg | INTRAVENOUS | Status: DC | PRN
  Filled 2014-06-06: qty 2

## 2014-06-06 MED ORDER — OXYTOCIN 40 UNITS IN LACTATED RINGERS INFUSION - SIMPLE MED
1.0000 m[IU]/min | INTRAVENOUS | Status: DC
  Administered 2014-06-06: 2 m[IU]/min via INTRAVENOUS

## 2014-06-06 MED ORDER — LIDOCAINE HCL (PF) 1 % IJ SOLN
INTRAMUSCULAR | Status: DC | PRN
  Administered 2014-06-06: 8 mL
  Administered 2014-06-06: 2 mL
  Administered 2014-06-06: 8 mL

## 2014-06-06 MED ORDER — MAGNESIUM SULFATE 40 G IN LACTATED RINGERS - SIMPLE
2.0000 g/h | INTRAVENOUS | Status: AC
  Administered 2014-06-06: 2 g/h via INTRAVENOUS
  Filled 2014-06-06: qty 500

## 2014-06-06 MED ORDER — FENTANYL CITRATE (PF) 100 MCG/2ML IJ SOLN
INTRAMUSCULAR | Status: DC | PRN
  Administered 2014-06-06 (×2): 100 ug via EPIDURAL

## 2014-06-06 MED ORDER — TERBUTALINE SULFATE 1 MG/ML IJ SOLN: 0.2500 mg | Freq: Once | INTRAMUSCULAR | Status: AC | PRN

## 2014-06-06 MED ORDER — PHENYLEPHRINE 40 MCG/ML (10ML) SYRINGE FOR IV PUSH (FOR BLOOD PRESSURE SUPPORT)
80.0000 ug | PREFILLED_SYRINGE | INTRAVENOUS | Status: DC | PRN
  Filled 2014-06-06: qty 2
  Filled 2014-06-06 (×2): qty 20

## 2014-06-06 MED ORDER — BUPIVACAINE HCL (PF) 0.25 % IJ SOLN
INTRAMUSCULAR | Status: DC | PRN
  Administered 2014-06-06 (×2): 8 mL via EPIDURAL
  Administered 2014-06-06 (×3): 2 mL

## 2014-06-06 MED ORDER — DIPHENHYDRAMINE HCL 50 MG/ML IJ SOLN: 12.5000 mg | INTRAMUSCULAR | Status: DC | PRN

## 2014-06-06 MED ORDER — IBUPROFEN 600 MG PO TABS
600.0000 mg | ORAL_TABLET | Freq: Four times a day (QID) | ORAL | Status: DC
  Administered 2014-06-07 – 2014-06-08 (×6): 600 mg via ORAL
  Filled 2014-06-06 (×6): qty 1

## 2014-06-06 MED ORDER — SODIUM BICARBONATE 8.4 % IV SOLN
INTRAVENOUS | Status: DC | PRN
  Administered 2014-06-06: 2 mL via EPIDURAL
  Administered 2014-06-06 (×3): 5 mL via EPIDURAL
  Administered 2014-06-06: 2 mL via EPIDURAL

## 2014-06-06 MED ORDER — MAGNESIUM SULFATE BOLUS VIA INFUSION
4.0000 g | Freq: Once | INTRAVENOUS | Status: AC
  Administered 2014-06-06: 4 g via INTRAVENOUS
  Filled 2014-06-06: qty 500

## 2014-06-06 MED ORDER — LACTATED RINGERS IV SOLN: 500.0000 mL | Freq: Once | INTRAVENOUS | Status: DC

## 2014-06-06 MED ORDER — FENTANYL 2.5 MCG/ML BUPIVACAINE 1/10 % EPIDURAL INFUSION (WH - ANES)
14.0000 mL/h | INTRAMUSCULAR | Status: DC | PRN
  Administered 2014-06-06 (×6): 14 mL/h via EPIDURAL
  Filled 2014-06-06 (×4): qty 125

## 2014-06-06 NOTE — Progress Notes (Signed)
Patient ID: Sandy DawsonLaura J Perez, female   DOB: 07-Jan-1991, 24 y.o.   MRN: [id number] Cervix 2 cm and 50% arom clear fhr cat one Begin antibiotics and pitocin Risk discussed

## 2014-06-06 NOTE — Anesthesia Procedure Notes (Addendum)
Epidural Patient location during procedure: OB Start time: 06/06/2014 8:54 AM End time: 06/06/2014 8:58 AM  Staffing Anesthesiologist: Leilani AbleHATCHETT, Elverna Caffee Performed by: anesthesiologist   Preanesthetic Checklist Completed: patient identified, surgical consent, pre-op evaluation, timeout performed, IV checked, risks and benefits discussed and monitors and equipment checked  Epidural Patient position: sitting Prep: site prepped and draped and DuraPrep Patient monitoring: continuous pulse ox and blood pressure Approach: midline Location: L3-L4 Injection technique: LOR air  Needle:  Needle type: Tuohy  Needle gauge: 17 G Needle length: 9 cm and 9 Needle insertion depth: 8 cm Catheter type: closed end flexible Catheter size: 19 Gauge Catheter at skin depth: 12 cm Test dose: negative and Other  Assessment Sensory level: T9 Events: blood not aspirated, injection not painful, no injection resistance, negative IV test and no paresthesia  Additional Notes Reason for block:procedure for pain  Epidural Patient location during procedure: OB Start time: 06/06/2014 5:15 PM End time: 06/06/2014 5:24 PM  Staffing Anesthesiologist: Leilani AbleHATCHETT, Rosy Estabrook Performed by: anesthesiologist   Preanesthetic Checklist Completed: patient identified, surgical consent, pre-op evaluation, timeout performed, IV checked, risks and benefits discussed and monitors and equipment checked  Epidural Patient position: sitting Prep: site prepped and draped and DuraPrep Patient monitoring: continuous pulse ox and blood pressure Approach: midline Location: L2-L3 Injection technique: LOR air  Needle:  Needle type: Tuohy  Needle gauge: 17 G Needle length: 9 cm and 9 Needle insertion depth: 9 cm Catheter type: closed end flexible Catheter size: 19 Gauge Catheter at skin depth: 13 cm Test dose: positive and Other  Assessment Sensory level: T10 Events: blood not aspirated, injection not painful, no  injection resistance, negative IV test and no paresthesia  Additional Notes Needle pulled back to - CSF, then catheter threaded 5cm w/o paresthesia.Reason for block:procedure for pain

## 2014-06-06 NOTE — Anesthesia Preprocedure Evaluation (Signed)
Anesthesia Evaluation  Patient identified by MRN, date of birth, ID band Patient awake    Reviewed: Allergy & Precautions, H&P , NPO status , Patient's Chart, lab work & pertinent test results  Airway Mallampati: II  TM Distance: >3 FB Neck ROM: full    Dental no notable dental hx.    Pulmonary neg pulmonary ROS,    Pulmonary exam normal       Cardiovascular negative cardio ROS      Neuro/Psych    GI/Hepatic Neg liver ROS,   Endo/Other  Morbid obesity  Renal/GU negative Renal ROS     Musculoskeletal   Abdominal (+) + obese,   Peds  Hematology negative hematology ROS (+)   Anesthesia Other Findings   Reproductive/Obstetrics (+) Pregnancy                             Anesthesia Physical Anesthesia Plan  ASA: III  Anesthesia Plan: Epidural   Post-op Pain Management:    Induction:   Airway Management Planned:   Additional Equipment:   Intra-op Plan:   Post-operative Plan:   Informed Consent: I have reviewed the patients History and Physical, chart, labs and discussed the procedure including the risks, benefits and alternatives for the proposed anesthesia with the patient or authorized representative who has indicated his/her understanding and acceptance.     Plan Discussed with:   Anesthesia Plan Comments:         Anesthesia Quick Evaluation

## 2014-06-07 ENCOUNTER — Encounter (HOSPITAL_COMMUNITY): Payer: Self-pay | Admitting: *Deleted

## 2014-06-07 LAB — COMPREHENSIVE METABOLIC PANEL
ALBUMIN: 2.5 g/dL — AB (ref 3.5–5.2)
ALT: 14 U/L (ref 0–35)
AST: 19 U/L (ref 0–37)
Alkaline Phosphatase: 89 U/L (ref 39–117)
Anion gap: 7 (ref 5–15)
BUN: 8 mg/dL (ref 6–23)
CALCIUM: 9.1 mg/dL (ref 8.4–10.5)
CO2: 23 mmol/L (ref 19–32)
Chloride: 104 mmol/L (ref 96–112)
Creatinine, Ser: 0.59 mg/dL (ref 0.50–1.10)
GFR calc Af Amer: >90 mL/min (ref >90)
GFR calc non Af Amer: >90 mL/min (ref >90)
Glucose, Bld: 111 mg/dL — ABNORMAL HIGH (ref 70–99)
Potassium: 4.3 mmol/L (ref 3.5–5.1)
SODIUM: 134 mmol/L — AB (ref 135–145)
TOTAL PROTEIN: 5.2 g/dL — AB (ref 6.0–8.3)
Total Bilirubin: 0.4 mg/dL (ref 0.3–1.2)

## 2014-06-07 LAB — CBC
HCT: 27.8 % — ABNORMAL LOW (ref 36.0–46.0)
HEMOGLOBIN: 9.6 g/dL — AB (ref 12.0–15.0)
MCH: 29.8 pg (ref 26.0–34.0)
MCHC: 34.5 g/dL (ref 30.0–36.0)
MCV: 86.3 fL (ref 78.0–100.0)
Platelets: 221 10*3/uL (ref 150–400)
RBC: 3.22 MIL/uL — ABNORMAL LOW (ref 3.87–5.11)
RDW: 13.8 % (ref 11.5–15.5)
WBC: 16.8 10*3/uL — ABNORMAL HIGH (ref 4.0–10.5)

## 2014-06-07 LAB — MRSA PCR SCREENING: MRSA by PCR: NEGATIVE

## 2014-06-07 MED ORDER — ZOLPIDEM TARTRATE 5 MG PO TABS: 5.0000 mg | ORAL_TABLET | Freq: Every evening | ORAL | Status: DC | PRN

## 2014-06-07 MED ORDER — OXYCODONE-ACETAMINOPHEN 5-325 MG PO TABS: 2.0000 | ORAL_TABLET | ORAL | Status: DC | PRN

## 2014-06-07 MED ORDER — SODIUM CHLORIDE 0.9 % IJ SOLN
3.0000 mL | Freq: Two times a day (BID) | INTRAMUSCULAR | Status: DC
  Administered 2014-06-07: 3 mL via INTRAVENOUS

## 2014-06-07 MED ORDER — LACTATED RINGERS IV SOLN
INTRAVENOUS | Status: AC
  Administered 2014-06-07 (×2): via INTRAVENOUS

## 2014-06-07 MED ORDER — FAMOTIDINE 20 MG PO TABS
20.0000 mg | ORAL_TABLET | Freq: Two times a day (BID) | ORAL | Status: DC
  Administered 2014-06-07 (×2): 20 mg via ORAL
  Filled 2014-06-07 (×2): qty 1

## 2014-06-07 MED ORDER — BENZOCAINE-MENTHOL 20-0.5 % EX AERO
1.0000 "application " | INHALATION_SPRAY | CUTANEOUS | Status: DC | PRN
  Administered 2014-06-07: 1 via TOPICAL
  Filled 2014-06-07: qty 56

## 2014-06-07 MED ORDER — ONDANSETRON HCL 4 MG/2ML IJ SOLN: 4.0000 mg | INTRAMUSCULAR | Status: DC | PRN

## 2014-06-07 MED ORDER — SIMETHICONE 80 MG PO CHEW: 80.0000 mg | CHEWABLE_TABLET | ORAL | Status: DC | PRN

## 2014-06-07 MED ORDER — DIBUCAINE 1 % RE OINT
1.0000 | TOPICAL_OINTMENT | RECTAL | Status: DC | PRN
Start: 2014-06-07 — End: 2014-06-08

## 2014-06-07 MED ORDER — SODIUM CHLORIDE 0.9 % IJ SOLN: 3.0000 mL | INTRAMUSCULAR | Status: DC | PRN

## 2014-06-07 MED ORDER — FLEET ENEMA 7-19 GM/118ML RE ENEM: 1.0000 | ENEMA | Freq: Every day | RECTAL | Status: DC | PRN

## 2014-06-07 MED ORDER — DIPHENHYDRAMINE HCL 25 MG PO CAPS: 25.0000 mg | ORAL_CAPSULE | Freq: Four times a day (QID) | ORAL | Status: DC | PRN

## 2014-06-07 MED ORDER — ONDANSETRON HCL 4 MG PO TABS: 4.0000 mg | ORAL_TABLET | ORAL | Status: DC | PRN

## 2014-06-07 MED ORDER — BISACODYL 10 MG RE SUPP: 10.0000 mg | Freq: Every day | RECTAL | Status: DC | PRN

## 2014-06-07 MED ORDER — SENNOSIDES-DOCUSATE SODIUM 8.6-50 MG PO TABS
2.0000 | ORAL_TABLET | ORAL | Status: DC
  Administered 2014-06-07: 2 via ORAL
  Filled 2014-06-07 (×2): qty 2

## 2014-06-07 MED ORDER — WITCH HAZEL-GLYCERIN EX PADS: 1.0000 "application " | MEDICATED_PAD | CUTANEOUS | Status: DC | PRN

## 2014-06-07 MED ORDER — PRENATAL MULTIVITAMIN CH
1.0000 | ORAL_TABLET | Freq: Every day | ORAL | Status: DC
  Administered 2014-06-07: 1 via ORAL
  Filled 2014-06-07: qty 1

## 2014-06-07 MED ORDER — OXYCODONE-ACETAMINOPHEN 5-325 MG PO TABS: 1.0000 | ORAL_TABLET | ORAL | Status: DC | PRN

## 2014-06-07 MED ORDER — LANOLIN HYDROUS EX OINT: TOPICAL_OINTMENT | CUTANEOUS | Status: DC | PRN

## 2014-06-07 MED ORDER — ACETAMINOPHEN 325 MG PO TABS: 650.0000 mg | ORAL_TABLET | ORAL | Status: DC | PRN

## 2014-06-07 MED ORDER — TETANUS-DIPHTH-ACELL PERTUSSIS 5-2.5-18.5 LF-MCG/0.5 IM SUSP
0.5000 mL | Freq: Once | INTRAMUSCULAR | Status: DC
  Filled 2014-06-07: qty 0.5

## 2014-06-07 MED ORDER — RHO D IMMUNE GLOBULIN 1500 UNIT/2ML IJ SOSY
300.0000 ug | PREFILLED_SYRINGE | Freq: Once | INTRAMUSCULAR | Status: AC
  Administered 2014-06-07: 300 ug via INTRAVENOUS
  Filled 2014-06-07: qty 2

## 2014-06-07 NOTE — Progress Notes (Signed)
No HA, no epigastric pain  VSS Afeb  BP  120-130s/70-83 UO good Magnesium sulfate running DTR 1+  Abd-uterus firm , NT         No epigastric tenderness  Results for orders placed or performed during the hospital encounter of 06/03/14 (from the past 24 hour(s))  CBC     Status: Abnormal   Collection Time: 06/06/14 10:30 PM  Result Value Ref Range   WBC 19.2 (H) 4.0 - 10.5 K/uL   RBC 3.68 (L) 3.87 - 5.11 MIL/uL   Hemoglobin 11.1 (L) 12.0 - 15.0 g/dL   HCT 16.131.4 (L) 09.636.0 - 04.546.0 %   MCV 85.3 78.0 - 100.0 fL   MCH 30.2 26.0 - 34.0 pg   MCHC 35.4 30.0 - 36.0 g/dL   RDW 40.913.8 81.111.5 - 91.415.5 %   Platelets 212 150 - 400 K/uL  MRSA PCR Screening     Status: None   Collection Time: 06/06/14 11:25 PM  Result Value Ref Range   MRSA by PCR NEGATIVE NEGATIVE  CBC     Status: Abnormal   Collection Time: 06/07/14  5:30 AM  Result Value Ref Range   WBC 16.8 (H) 4.0 - 10.5 K/uL   RBC 3.22 (L) 3.87 - 5.11 MIL/uL   Hemoglobin 9.6 (L) 12.0 - 15.0 g/dL   HCT 78.227.8 (L) 95.636.0 - 21.346.0 %   MCV 86.3 78.0 - 100.0 fL   MCH 29.8 26.0 - 34.0 pg   MCHC 34.5 30.0 - 36.0 g/dL   RDW 08.613.8 57.811.5 - 46.915.5 %   Platelets 221 150 - 400 K/uL  Comprehensive metabolic panel     Status: Abnormal   Collection Time: 06/07/14  5:30 AM  Result Value Ref Range   Sodium 134 (L) 135 - 145 mmol/L   Potassium 4.3 3.5 - 5.1 mmol/L   Chloride 104 96 - 112 mmol/L   CO2 23 19 - 32 mmol/L   Glucose, Bld 111 (H) 70 - 99 mg/dL   BUN 8 6 - 23 mg/dL   Creatinine, Ser 6.290.59 0.50 - 1.10 mg/dL   Calcium 9.1 8.4 - 52.810.5 mg/dL   Total Protein 5.2 (L) 6.0 - 8.3 g/dL   Albumin 2.5 (L) 3.5 - 5.2 g/dL   AST 19 0 - 37 U/L   ALT 14 0 - 35 U/L   Alkaline Phosphatase 89 39 - 117 U/L   Total Bilirubin 0.4 0.3 - 1.2 mg/dL   GFR calc non Af Amer >90 >90 mL/min   GFR calc Af Amer >90 >90 mL/min   Anion gap 7 5 - 15    A/P: Preeclampsia          Stable         Continue magnesium sulfate until this evening

## 2014-06-07 NOTE — Progress Notes (Signed)
This was a follow-up visit with Sandy Perez whom I first met on antenatal.  She was doing well and was thrilled with her baby, Sandy Perez.  I offered my congratulations and gave them some space to spend time as a family.  [employer] Pager, [phone] 4:10 PM    06/07/14 1600  Clinical Encounter Type  Visited With Patient and family together  Visit Type Follow-up

## 2014-06-07 NOTE — Progress Notes (Signed)
UR chart review completed.  

## 2014-06-07 NOTE — Anesthesia Postprocedure Evaluation (Signed)
Anesthesia Post Note  Patient: Sandy DawsonLaura J Perez  Procedure(s) Performed: * No procedures listed *  Anesthesia type: Epidural  Patient location: Mother/Baby  Post pain: Pain level controlled  Post assessment: Post-op Vital signs reviewed  Last Vitals:  Filed Vitals:   06/07/14 0740  BP:   Pulse:   Temp: 36.4 C  Resp:     Post vital signs: Reviewed  Level of consciousness:alert  Complications: No apparent anesthesia complications

## 2014-06-07 NOTE — Lactation Note (Signed)
This note was copied from the chart of Sandy Perez. Lactation Consultation Note Follow up visit made.  Mom is currently in AICU for treatment of pre-eclampsia.  She states baby is nursing well using 20 mm nipple shield.  Baby just finished feeding prior to my visit.  Recommended good breast massage and compression during feeding to increase milk flow and intake.  Mom continues to post pump for breast stimulation and extra calories.  Encouraged to call for assist/concerns.  Patient Name: Sandy Perez ZOXWR'UToday's Date: 06/07/2014     Maternal Data    Feeding Feeding Type: Breast Fed Length of feed: 15 min  LATCH Score/Interventions Latch: Grasps breast easily, tongue down, lips flanged, rhythmical sucking.  Audible Swallowing: Spontaneous and intermittent Intervention(s): Skin to skin Intervention(s): Skin to skin  Type of Nipple: Flat Intervention(s): Shells  Comfort (Breast/Nipple): Soft / non-tender     Hold (Positioning): Assistance needed to correctly position infant at breast and maintain latch. Intervention(s): Breastfeeding basics reviewed;Support Pillows;Position options;Skin to skin  LATCH Score: 8  Lactation Tools Discussed/Used     Consult Status      Huston FoleyMOULDEN, Rudie S 06/07/2014, 11:44 AM

## 2014-06-07 NOTE — Lactation Note (Addendum)
This note was copied from the chart of Sandy Perez. Lactation Consultation Note New mom too BF classes. Hx: PCOS. Has large pendulum breast w/flat nipples. Will roll in finger tips to define boarders of nipple but compress flat when hand expressed.  Rt. Nipple fitted #20 NS, Lt. Nipple fitted #16 NS. Mom taught application and care of NS.  Shells given to wear in Bra to assist in everting nipples, encouraged to wear between feedings. Hand expression taught w/noted colostrum of 5ml. Gave to baby via curve tip syring during BF on NS for suckling stimulation. Mom shown how to use DEBP & how to disassemble, clean, & reassemble parts. Mom knows to pump q3h for 15-20 min. Mom encouraged to feed baby 8-12 times/24 hours and with feeding cues. Referred to Baby and Me Book in Breastfeeding section Pg. 22-23 for position options and Proper latch demonstration. Educated about newborn behavior. Gave LPI information sheet to watch for s/sx of ETI acting like a LPI. Mom Encouraged to do skin-to-skin. WH/LC brochure given w/resources, support groups and LC services. Patient Name: Sandy Perez ZOXWR'UToday's Date: Perez Reason for consult: Initial assessment;Breast/nipple pain WH/LC brochure given w/resources, support groups and LC services. Maternal Data Has patient been taught Hand Expression?: Yes Does the patient have breastfeeding experience prior to this delivery?: No  Feeding Feeding Type: Breast Milk Length of feed: 20 min  LATCH Score/Interventions Latch: Grasps breast easily, tongue down, lips flanged, rhythmical sucking.  Audible Swallowing: A few with stimulation Intervention(s): Skin to skin;Hand expression Intervention(s): Hand expression;Alternate breast massage  Type of Nipple: Flat Intervention(s): Shells;Double electric pump  Comfort (Breast/Nipple): Soft / non-tender     Hold (Positioning): Assistance needed to correctly position infant at breast and maintain  latch. Intervention(s): Skin to skin;Position options;Support Pillows;Breastfeeding basics reviewed  LATCH Score: 7  Lactation Tools Discussed/Used Tools: Shells;Nipple Dorris CarnesShields;Pump Nipple shield size: 16;20 Shell Type: Inverted Breast pump type: Double-Electric Breast Pump WIC Program: No Pump Review: Setup, frequency, and cleaning;Milk Storage Initiated by:: Peri JeffersonL. Marcela Alatorre RN Date initiated:: 06/07/14   Consult Status Consult Status: Follow-up Date: 06/07/14 Follow-up type: In-patient    Charyl DancerCARVER, Sandy Perez, Sandy Perez

## 2014-06-08 ENCOUNTER — Encounter (HOSPITAL_COMMUNITY): Payer: Self-pay | Admitting: *Deleted

## 2014-06-08 LAB — RH IG WORKUP (INCLUDES ABO/RH)
ABO/RH(D): B NEG
Fetal Screen: NEGATIVE
GESTATIONAL AGE(WKS): 37
Unit division: 0

## 2014-06-08 MED ORDER — IBUPROFEN 600 MG PO TABS: 600.0000 mg | ORAL_TABLET | Freq: Four times a day (QID) | ORAL | Status: DC

## 2014-06-08 NOTE — Progress Notes (Signed)
Clinical Social Work Department PSYCHOSOCIAL ASSESSMENT - MATERNAL/CHILD 06/08/2014  Patient:  Sandy Perez,Sandy Perez  Account Number:  [id number]  Admit Date:  06/03/2014  Marjo Bickerhilds Name:   Sandy Perez   Clinical Social Worker:  Loleta BooksSARAH Jailynn Lavalais, CLINICAL SOCIAL WORKER   Date/Time:  06/08/2014 09:00 AM  Date Referred:  06/06/2014   Referral source  Central Nursery     Referred reason  Depression/Anxiety   Other referral source:    I:  FAMILY / HOME ENVIRONMENT Child's legal guardian:  PARENT  Guardian - Name Guardian - Age Guardian - Address  Sandy Perez 23 [address] [place], [place] [id number]  Sandy Perez  same as above   Other household support members/support persons Other support:   MOB reported that her parents and the FOB's parents are very supportive and involved. She stated that her mother is a Hospital doctorICU RN and will be helping her if needed.    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   Financial resources:  Media plannerrivate Insurance If [employer] EnergyMedicaid - [place] EnergyCounty:  [place] Other  [facility]   School / Grade:  N/A Government social research officerMaternity Care Coordinator / Child Services Coordination / Early Interventions:   None reported  Cultural issues impacting care:   None reported    III  STRENGTHS Strengths  Adequate Resources  Home prepared for Child (including basic supplies)  Supportive family/friends   Strength comment:    IV  RISK FACTORS AND CURRENT PROBLEMS Current Problem:  YES   Risk Factor & Current Problem Patient Issue Family Issue Risk Factor / Current Problem Comment  Mental Illness Y N MOB presents with a history of depression/anxiety.  MOB attempted suicide June 2015 and participated in therapy and medication management until she learned of the pregnancy.    V  SOCIAL WORK ASSESSMENT CSW received consult for history of depression/anxiety.  CSW completed chart  review prior to assessment, and noted that MOB endorsed symptoms of anxiety/depression February 2015 due to recent miscarriage and financial stressors.  MOB attempted suicide in June 2015, and has a history of being prescribed Xanax and Cymbalta.    MOB and FOB presented as easily engaged and receptive to the visit.  They warmly welcomed CSW into the room, and expressed excitement as they prepare to transition home.  MOB smiled and displayed a full range in affect during the entire visit. MOB and FOB discussed numerous times that they are grateful that Sandy Perez has arrived since MOB had a previous miscarriage and they were informed that conceiving may be difficult due to medical conditions.   MOB shared that it has been the "perfect" pregnancy, and stated that even despite needing to be in the AICU after delivery, she has enjoyed her experiences during the pregnancy and now as a mother.  Her comments highlighted that motherhood has been much anticipated, and shared that she has been wanting to be a mother for "years".  She discussed belief that she feels comfortable and confident caring for the infant due to previous experiences caring for children and having a strong support system.   CSW continued to explore with MOB and FOB normative range of emotions that accompany the transition to parenthood.   MOB denied mental health needs during the pregnancy.  She acknowledged history of depression and anxiety, and reported belief that it was closely linked to her miscarriage (2014) since she had wanted  to be a mother.  CSW did not warrant it clinically necessary to discuss in detail about her suicide attempt in June 2015, but MOB stated that she does not ever want to "get that depressed" again.  She reflected upon resolution of symptoms after initiating therapy and medications, and shared that she discontinued services at [facility] when she learned of the pregnancy. She denied any lingering symptoms, but  verbalized an understanding that she has an increased risk for developing postpartum mood disorders given her recent acute history. She shared belief that she can talk to her mother and return to her previous providers if needed.  CSW emphasized importance of initiating care when she notes initial concerns, and MOB recognized the importance of taking care of herself. She stated that she cannot imagine decompensating since it would have a negative impact on her infant. MOB presented as motivated and verbalized intentions to closely monitor and treat any mental health needs as they arise.   MOB denied additional questions, concerns, or needs at this time. MOB and FOB expressed appreciation for the visit, and agreed to contact CSW prior to discharge if needs arise.     VI SOCIAL WORK PLAN Social Work Secretary/administrator  No Further Intervention Required / No Barriers to Discharge   Type of pt/family education:   Postpartum depression/anxiety   If child protective services report - county: N/A   If child protective services report - date:  N/A Information/referral to community resources comment:   CSW reviewed mental health resources.  MOB reported ability to retun to [facility] if she notes return of symptoms.  CSW disucssed signs and symptoms that may indicate need to seek medical treatment.   Other social work plan:   MOB inquired about questions about adding infant to Medicaid.  CSW provided MOB and FOB with Medicaid contact information.    CSW to follow up PRN.

## 2014-06-08 NOTE — Lactation Note (Signed)
This note was copied from the chart of Sandy Perez. Lactation Consultation Note  Patient Name: Sandy Perez ZOXWR'UToday's Date: Perez Reason for consult: Follow-up assessment  Baby 36 hours old and per mom cluster fed all night with the use of #24 Nipple shield for latching.  Baby  is at 4% weight loss, Breast feeding range 17 - 40 mins, 2 wets ( doc in doc flow sheets , per day add 4 more wets that  Were with stools diapers, dad changed ), 11 stools, LS's 7-8 ( 8 with LC ), Serum Bili - 9.6. LC reviewed basics , assessed moms breast tissue with her permission and noted the areolas to be compressible. Baby woke up ,LC placed baby skin to skin , and latched the baby without a nipple shield. Baby was able to sustain a deep latch  With multiply swallows, increasing with breast compressions. Per mom comfortable with latch.  Baby released after 20 mins , nipple appeared normal. LC talked mom through latching baby on the right side with tea cup hold and  Mom was able to latch with depth. Also showed dad how to assist mom if needed. 2nd breast baby latched for 30 mins.  Sore nipple and engorgement prevention reviewed. LC instructed mom on the use hand pump for pre-pumping to make the nipple more erect. [facility] dad [facility] . Apt reminder given to mom.     Maternal Data Has patient been taught Hand Expression?: Yes  Feeding Feeding Type: Breast Fed Length of feed:  (swallows noted )  LATCH Score/Interventions Latch: Grasps breast easily, tongue down, lips flanged, rhythmical sucking. Intervention(s): Skin to skin;Teach feeding cues;Waking techniques Intervention(s): Adjust position;Assist with latch;Breast compression  Audible Swallowing: Spontaneous and intermittent  Type of Nipple: Flat (compressible areolas )  Comfort (Breast/Nipple): Soft / non-tender     Hold (Positioning): No assistance needed to correctly position  infant at breast. Intervention(s): Breastfeeding basics reviewed;Support Pillows;Position options;Skin to skin  LATCH Score: 9  Lactation Tools Discussed/Used Tools: Shells Nipple shield size: 20;24;Other (comment) (attempted latch with NS , and ended up latching STS) Shell Type: Inverted Breast pump type: Manual   Consult Status Consult Status: Follow-up Date: 06/14/14 (at 1pm LC O/P apt , reminder sheet given to mom ) Follow-up type: Out-patient    Kathrin Greathouseorio, Marwa Fuhrman Ann Perez, 10:50 AM

## 2014-06-08 NOTE — Discharge Summary (Signed)
Obstetric Discharge Summary Reason for Admission: observation/evaluation Prenatal Procedures: NST and ultrasound Intrapartum Procedures: spontaneous vaginal delivery Postpartum Procedures: none Complications-Operative and Postpartum: 1 degree perineal laceration HEMOGLOBIN  Date Value Ref Range Status  06/07/2014 9.6* 12.0 - 15.0 g/dL Final  16/10/960409/05/2011 54.012.3 12.2 - 16.2 g/dL Final   HCT  Date Value Ref Range Status  06/07/2014 27.8* 36.0 - 46.0 % Final   HCT, POC  Date Value Ref Range Status  10/24/2011 40.1 37.7 - 47.9 % Final    Physical Exam:  General: alert and cooperative Lochia: appropriate Uterine Fundus: firm Incision: healing well DVT Evaluation: No evidence of DVT seen on physical exam. Negative Homan's sign. No cords or calf tenderness. Calf/Ankle edema is present. DTR's 2+ no clonus  Discharge Diagnoses: Term Pregnancy-delivered and Preelampsia  Discharge Information: Date: 06/08/2014 Activity: pelvic rest Diet: routine Medications: PNV and Ibuprofen Condition: stable Instructions: refer to practice specific booklet Discharge to: home   Newborn Data: Live born female  Birth Weight: 5 lb 14 oz (2665 g) APGAR: 8, 9  Home with mother.  CURTIS,CAROL G 06/08/2014, 8:30 AM

## 2014-06-14 ENCOUNTER — Ambulatory Visit (HOSPITAL_COMMUNITY)
Admit: 2014-06-14 | Discharge: 2014-06-14 | Disposition: A | Discharge status: Home / Self Care | Payer: 59 | Attending: Obstetrics and Gynecology | Admitting: Obstetrics and Gynecology

## 2014-06-14 NOTE — Lactation Note (Signed)
Lactation Consult  Mother's reason for visit:  Follow up from hospital Visit Type:  Feeding assessment Appointment Notes: 37 weeks Consult:  Initial Lactation Consultant:  Huston FoleyMOULDEN, [name]    Baby's Name: Sandy Perez Date of Birth: 06/06/2013 Pediatrician: [facility] [place] Gender: female Gestational Age: 4132w0d (At Birth) Birth Weight: 5 lb 14 oz (2665 g) Weight at Discharge: Weight: 5 lb 10.3 oz (2560 g)Date of Dis[facility]   06/06/14 2126 06/06/14 2245 06/07/14 2135  Weight: 5 lb 14 oz (2665 g) 5 lb 14 oz (2665 g) 5 lb 10.3 oz (2560 g)   [facility]-10 on 06/10/14 Location:Pediatrician's office Weight today: 6-0.4    ________________________________________________________________________  Mother's Name: Sandy DawsonLaura J Perez Type of delivery:  Vaginal Breastfeeding Experience:  First baby Maternal Medical Conditions:  Polycystic ovarian syndrome   ________________________________________________________________________  Breastfeeding History (Post Discharge)  Frequency of breastfeeding:  Every 2-4 hours Duration of feeding:  15-30 minutes  Supplementation  Breastmilk:  Volume 60ml Frequency:  Once per day Total volume per day:  60ml  Method:  Bottle,   Pumping  Type of pump:  Manual and Medela pump in style Frequency:  6-8 times/24 hours Volume:  60ml  Infant Intake and Output Assessment  Voids:  6 in 24 hrs.  Color:  Clear yellow Stools:  2+ in 24 hrs.  Color:  Yellow  ________________________________________________________________________  Maternal Breast Assessment  Breast:  Full Nipple:  Flat    _______________________________________________________________________ Feeding Assessment/Evaluation  Mom and 8 day old infant here for feeding assessment.  Mom feels like  breastfeeding is going well.  Baby has gained 6.4 ounces/3 days.  Mom has an abundant milk supply.  Observed baby latch easily to breast using a 20 mm nipple shield.  Baby was initially latched shallow but obtained depth once milk let down.  Baby nursed actively with minimal stimulation.  Baby nursed for 20 minutes and transferred 52 mls.  Instructed to continue feeding with cues and post pump only as desired.  Encouraged mom to attend support groups and information given.  No concerns or questions at present.  Baby has a follow up appointment with pediatrician on 06/18/14.  [facility] concerns prn.  Initial feeding assessment:  Infant's oral assessment:  Variance/ tight lingual frenulum  Positioning:  Football Left breast  LATCH documentation:  Latch:  2 = Grasps breast easily, tongue down, lips flanged, rhythmical sucking.  WITH 20 MM NIPPLE SHIELD  Audible swallowing:  2 = Spontaneous and intermittent  Type of nipple:  1 = Flat  Comfort (Breast/Nipple):  2 = Soft / non-tender  Hold (Positioning):  2 = No assistance needed to correctly position infant at breast  LATCH score:  9  Attached assessment:  Deep  Lips flanged:  Yes.    Lips untucked:  No.  Suck assessment:  Nutritive  Tools:  Nipple shield 20 mm Instructed on use and cleaning of tool:  Yes.    Pre-feed weight:  2732 g  Post-feed weight:  2784 g Amount transferred:  52 ml Amount supplemented:  0 ml

## 2014-07-06 ENCOUNTER — Telehealth: Payer: Self-pay | Admitting: General Practice

## 2014-07-06 ENCOUNTER — Ambulatory Visit: Payer: 59 | Admitting: Family Medicine

## 2014-07-06 NOTE — Telephone Encounter (Signed)
Per Dr. Beverely Lowabori pt need a no show fee for appointment this morning. Pt called at 8:10 to cancel her 8:30 appointment.   Also pt needs to be scheduled on Dr. Rennis Goldenabori's schedule for her mood follow up, should not be on Edward's schedule for this follow up.

## 2014-07-15 ENCOUNTER — Ambulatory Visit: Payer: 59 | Admitting: Medical

## 2014-07-20 ENCOUNTER — Other Ambulatory Visit: Payer: Self-pay | Admitting: Family Medicine

## 2014-07-20 ENCOUNTER — Other Ambulatory Visit: Payer: Self-pay | Admitting: Obstetrics and Gynecology

## 2014-07-20 NOTE — Telephone Encounter (Signed)
Med filled.  

## 2014-07-21 ENCOUNTER — Encounter: Payer: Self-pay | Admitting: Family Medicine

## 2014-07-21 ENCOUNTER — Ambulatory Visit (INDEPENDENT_AMBULATORY_CARE_PROVIDER_SITE_OTHER): Payer: 59 | Admitting: Family Medicine

## 2014-07-21 VITALS — BP 134/80 | HR 111 | Temp 98.1°F | Resp 16 | Wt 249.2 lb

## 2014-07-21 DIAGNOSIS — F418 Other specified anxiety disorders: Secondary | ICD-10-CM

## 2014-07-21 DIAGNOSIS — F419 Anxiety disorder, unspecified: Principal | ICD-10-CM

## 2014-07-21 DIAGNOSIS — F329 Major depressive disorder, single episode, unspecified: Secondary | ICD-10-CM

## 2014-07-21 DIAGNOSIS — F32A Depression, unspecified: Secondary | ICD-10-CM

## 2014-07-21 LAB — CYTOLOGY - PAP

## 2014-07-21 MED ORDER — DULOXETINE HCL 30 MG PO CPEP: 30.0000 mg | ORAL_CAPSULE | Freq: Every day | ORAL | Status: DC

## 2014-07-21 MED ORDER — ALPRAZOLAM 0.5 MG PO TABS: 0.5000 mg | ORAL_TABLET | Freq: Two times a day (BID) | ORAL | Status: DC | PRN

## 2014-07-21 NOTE — Progress Notes (Signed)
Pre visit review using our clinic review tool, if applicable. No additional management support is needed unless otherwise documented below in the visit note. 

## 2014-07-21 NOTE — Assessment & Plan Note (Signed)
Deteriorated since delivery of 1st child.  Pt now having multiple panic attacks weekly.  Will restart Cymbalta daily since pt is not breastfeeding.  Pt to only use alprazolam prn.  Pt is currently able to contract for both the safety of her and the baby.  Reviewed supportive care and red flags that should prompt return.  Pt expressed understanding and is in agreement w/ plan.

## 2014-07-21 NOTE — Progress Notes (Signed)
   Subjective:    Patient ID: Sandy Perez, female    DOB: 1990-03-04, 24 y.o.   MRN: [id number]  HPI Anxiety/depression- at last visit pt was on Cymbalta and Xanax prn.  Pt is not currently on any medication but continues to have 'severe anxiety and panic attacks'.  Not breast feeding.  Pt is currently having panic attacks multiple times/week.  Was off all medication during pregnancy- baby is now 16 weeks old.  No thoughts of hurting self or baby.   Review of Systems For ROS see HPI     Objective:   Physical Exam  Constitutional: She is oriented to person, place, and time. She appears well-developed and well-nourished. No distress.  obese  HENT:  Head: Normocephalic and atraumatic.  Eyes: Conjunctivae and EOM are normal. Pupils are equal, round, and reactive to light.  Cardiovascular: Normal rate, regular rhythm, normal heart sounds and intact distal pulses.   Pulmonary/Chest: Effort normal and breath sounds normal. No respiratory distress. She has no wheezes. She has no rales.  Musculoskeletal: She exhibits no edema.  Neurological: She is alert and oriented to person, place, and time.  Skin: Skin is warm and dry.  Psychiatric: She has a normal mood and affect. Her behavior is normal. Thought content normal.  Vitals reviewed.         Assessment & Plan:

## 2014-07-21 NOTE — Patient Instructions (Signed)
Follow up in 4-6 weeks to recheck anxiety Restart the Cymbalta daily and use the alprazolam as needed for those panicked moments Call with any questions or concerns CONGRATS!!!  She's beautiful!!!

## 2014-07-23 ENCOUNTER — Other Ambulatory Visit (HOSPITAL_COMMUNITY): Payer: Self-pay | Admitting: Orthopedic Surgery

## 2014-07-23 DIAGNOSIS — M545 Low back pain: Secondary | ICD-10-CM

## 2014-08-03 ENCOUNTER — Ambulatory Visit (HOSPITAL_COMMUNITY): Admission: RE | Admit: 2014-08-03 | Payer: 59 | Source: Ambulatory Visit

## 2014-08-25 ENCOUNTER — Ambulatory Visit: Payer: 59 | Admitting: Family Medicine

## 2014-10-29 ENCOUNTER — Encounter: Payer: Self-pay | Admitting: Family Medicine

## 2014-10-29 ENCOUNTER — Ambulatory Visit (INDEPENDENT_AMBULATORY_CARE_PROVIDER_SITE_OTHER): Payer: 59 | Admitting: Family Medicine

## 2014-10-29 VITALS — BP 128/80 | HR 100 | Temp 98.0°F | Resp 16 | Ht 66.0 in | Wt 249.0 lb

## 2014-10-29 DIAGNOSIS — F418 Other specified anxiety disorders: Secondary | ICD-10-CM | POA: Diagnosis not present

## 2014-10-29 DIAGNOSIS — F329 Major depressive disorder, single episode, unspecified: Secondary | ICD-10-CM

## 2014-10-29 DIAGNOSIS — F419 Anxiety disorder, unspecified: Principal | ICD-10-CM

## 2014-10-29 DIAGNOSIS — F32A Depression, unspecified: Secondary | ICD-10-CM

## 2014-10-29 MED ORDER — ALPRAZOLAM 0.5 MG PO TABS: 0.5000 mg | ORAL_TABLET | Freq: Two times a day (BID) | ORAL | Status: DC | PRN

## 2014-10-29 MED ORDER — DULOXETINE HCL 60 MG PO CPEP: 60.0000 mg | ORAL_CAPSULE | Freq: Every day | ORAL | Status: DC

## 2014-10-29 NOTE — Progress Notes (Signed)
Pre visit review using our clinic review tool, if applicable. No additional management support is needed unless otherwise documented below in the visit note. 

## 2014-10-29 NOTE — Patient Instructions (Signed)
Follow up in 3-4 weeks to recheck mood Increase the Cymbalta to  daily- new prescription sent You are doing a great job!  Keep it up! Call with any questions or concerns Have a great weekend!!!

## 2014-10-29 NOTE — Progress Notes (Signed)
   Subjective:    Patient ID: Sandy Perez, female    DOB: 10-18-1990, 24 y.o.   MRN: [id number]  HPI Anxiety/depression- pt reports 'really frequent anxiety attacks'.  Pt reports attacks multiple times/week.  Alprazolam will help somewhat but doesn't resolve the issue.  Still taking Cymbalta daily.  Pt had GI side effects w/ Buspirone.  Pt w/ 38 month old daughter, in process of moving.  Denies SI/HI.     Review of Systems For ROS see HPI     Objective:   Physical Exam  Constitutional: She is oriented to person, place, and time. She appears well-developed and well-nourished. No distress.  obese  HENT:  Head: Normocephalic and atraumatic.  Neurological: She is alert and oriented to person, place, and time.  Skin: Skin is warm and dry.  Psychiatric: She has a normal mood and affect. Her behavior is normal. Thought content normal.  Vitals reviewed.         Assessment & Plan:

## 2014-10-30 NOTE — Assessment & Plan Note (Signed)
Deteriorated.  Will increase Cymbalta to  as pt has had side effects when she has attempted to take other medications.  Applauded her decision to come for assistance.  Will follow.

## 2014-11-26 ENCOUNTER — Telehealth: Payer: Self-pay | Admitting: Family Medicine

## 2014-11-26 ENCOUNTER — Ambulatory Visit: Payer: 59 | Admitting: Family Medicine

## 2014-11-26 ENCOUNTER — Other Ambulatory Visit: Payer: Self-pay | Admitting: Family Medicine

## 2014-11-26 DIAGNOSIS — Z0289 Encounter for other administrative examinations: Secondary | ICD-10-CM

## 2014-11-26 NOTE — Telephone Encounter (Signed)
Rx sent to the pharmacy by e-script.//AB/CMA 

## 2014-12-06 NOTE — Telephone Encounter (Signed)
Pt was no show 11/26/14 10:30am, follow up appt, pt has not rescheduled, charge or no charge?

## 2014-12-06 NOTE — Telephone Encounter (Signed)
Yes- pt needs no show fee 

## 2014-12-24 ENCOUNTER — Ambulatory Visit (INDEPENDENT_AMBULATORY_CARE_PROVIDER_SITE_OTHER): Payer: 59 | Admitting: Internal Medicine

## 2014-12-24 ENCOUNTER — Encounter: Payer: Self-pay | Admitting: Internal Medicine

## 2014-12-24 VITALS — BP 116/74 | HR 90 | Temp 97.8°F | Ht 66.0 in | Wt 248.0 lb

## 2014-12-24 DIAGNOSIS — H9201 Otalgia, right ear: Secondary | ICD-10-CM | POA: Diagnosis not present

## 2014-12-24 MED ORDER — ANTIPYRINE-BENZOCAINE 5.4-1.4 % OT SOLN: 3.0000 [drp] | Freq: Three times a day (TID) | OTIC | Status: DC | PRN
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
## 2014-12-24 NOTE — Patient Instructions (Signed)
Flonase 2 sprays on each side of the nose daily for at least 3 weeks OTC Claritin for allergies  Use the eardrops only as needed if you have pain. If    severe pain or ear discharge: Don't use the drops, call the office

## 2014-12-24 NOTE — Progress Notes (Signed)
Pre visit review using our clinic review tool, if applicable. No additional management support is needed unless otherwise documented below in the visit note. 

## 2015-01-03 ENCOUNTER — Telehealth: Payer: Self-pay | Admitting: Family Medicine

## 2015-01-03 ENCOUNTER — Ambulatory Visit: Payer: 59 | Admitting: Family Medicine

## 2015-01-03 DIAGNOSIS — Z0289 Encounter for other administrative examinations: Secondary | ICD-10-CM

## 2015-01-05 NOTE — Telephone Encounter (Signed)
Pt was no show 01/03/15 1:15 for follow up appt, pt has not rescheduled, charge or no charge?

## 2015-01-05 NOTE — Telephone Encounter (Signed)
Yes- pt needs no show fee 

## 2015-01-18 ENCOUNTER — Other Ambulatory Visit: Payer: Self-pay | Admitting: Family Medicine

## 2015-01-18 NOTE — Telephone Encounter (Signed)
Medication filled to pharmacy as requested.   

## 2015-01-18 NOTE — Telephone Encounter (Signed)
Last OV 10/29/14 Alprazolam last filled 10/29/14 #30 with 2

## 2015-03-31 ENCOUNTER — Telehealth: Payer: Self-pay | Admitting: Family Medicine

## 2015-03-31 NOTE — Telephone Encounter (Signed)
Tried reaching patient regarding flu shot patient # is disconnected

## 2015-04-07 ENCOUNTER — Other Ambulatory Visit: Payer: Self-pay | Admitting: Family Medicine

## 2015-04-11 NOTE — Telephone Encounter (Signed)
Will refill but pt needs to schedule CPE ?

## 2015-04-11 NOTE — Telephone Encounter (Signed)
Last OV 10/29/14, no record of pt having a CPE in the last year.  Alprazolam last filled 01/08/15 #30 with 2  NO CSC or UDS

## 2015-04-11 NOTE — Telephone Encounter (Signed)
Medication filled to pharmacy as requested.   

## 2015-06-03 DIAGNOSIS — K529 Noninfective gastroenteritis and colitis, unspecified: Secondary | ICD-10-CM | POA: Diagnosis not present

## 2015-06-03 DIAGNOSIS — R61 Generalized hyperhidrosis: Secondary | ICD-10-CM | POA: Diagnosis not present

## 2015-06-03 DIAGNOSIS — F419 Anxiety disorder, unspecified: Secondary | ICD-10-CM | POA: Diagnosis not present

## 2015-06-03 DIAGNOSIS — R1031 Right lower quadrant pain: Secondary | ICD-10-CM | POA: Diagnosis not present

## 2015-06-03 DIAGNOSIS — Z79899 Other long term (current) drug therapy: Secondary | ICD-10-CM | POA: Diagnosis not present

## 2015-06-03 DIAGNOSIS — R112 Nausea with vomiting, unspecified: Secondary | ICD-10-CM | POA: Diagnosis not present

## 2015-06-03 DIAGNOSIS — R6883 Chills (without fever): Secondary | ICD-10-CM | POA: Diagnosis not present

## 2015-06-03 DIAGNOSIS — T50902A Poisoning by unspecified drugs, medicaments and biological substances, intentional self-harm, initial encounter: Secondary | ICD-10-CM | POA: Insufficient documentation

## 2015-06-18 ENCOUNTER — Emergency Department (INDEPENDENT_AMBULATORY_CARE_PROVIDER_SITE_OTHER)
Admission: EM | Admit: 2015-06-18 | Discharge: 2015-06-18 | Disposition: A | Discharge status: Home / Self Care | Payer: 59 | Source: Home / Self Care | Attending: Family Medicine | Admitting: Family Medicine

## 2015-06-18 ENCOUNTER — Encounter: Payer: Self-pay | Admitting: Emergency Medicine

## 2015-06-18 DIAGNOSIS — S335XXA Sprain of ligaments of lumbar spine, initial encounter: Secondary | ICD-10-CM

## 2015-06-18 MED ORDER — METHOCARBAMOL 750 MG PO TABS: 750.0000 mg | ORAL_TABLET | Freq: Four times a day (QID) | ORAL | Status: DC

## 2015-06-18 MED ORDER — HYDROCODONE-ACETAMINOPHEN 5-325 MG PO TABS: ORAL_TABLET | ORAL | Status: DC

## 2015-06-18 MED ORDER — MELOXICAM 15 MG PO TABS: 15.0000 mg | ORAL_TABLET | Freq: Every day | ORAL | Status: DC

## 2015-06-18 NOTE — Discharge Instructions (Signed)
Apply ice pack for 20 to 30 minutes, 3 to 4 times daily  Continue until pain decreases.  Begin back range of motion and stretching exercises as tolerated.   Back Injury Prevention Back injuries can be very painful. They can also be difficult to heal. After having one back injury, you are more likely to injure your back again. It is important to learn how to avoid injuring or re-injuring your back. The following tips can help you to prevent a back injury. WHAT SHOULD I KNOW ABOUT PHYSICAL FITNESS?  Exercise for 30 minutes per day on most days of the week or as directed by your health care provider. Make sure to:  Do aerobic exercises, such as walking, jogging, biking, or swimming.  Do exercises that increase balance and strength, such as tai chi and yoga. These can decrease your risk of falling and injuring your back.  Do stretching exercises to help with flexibility.  Try to develop strong abdominal muscles. Your abdominal muscles provide a lot of the support that is needed by your back.  Maintain a healthy weight. This helps to decrease your risk of a back injury. WHAT SHOULD I KNOW ABOUT MY DIET?  Talk with your health care provider about your overall diet. Take supplements and vitamins only as directed by your health care provider.  Talk with your health care provider about how much calcium and vitamin D you need each day. These nutrients help to prevent weakening of the bones (osteoporosis). Osteoporosis can cause broken (fractured) bones, which lead to back pain.  Include good sources of calcium in your diet, such as dairy products, green leafy vegetables, and products that have had calcium added to them (fortified).  Include good sources of vitamin D in your diet, such as milk and foods that are fortified with vitamin D. WHAT SHOULD I KNOW ABOUT MY POSTURE?  Sit up straight and stand up straight. Avoid leaning forward when you sit or hunching over when you stand.  Choose chairs  that have good low-back (lumbar) support.  If you work at a desk, sit close to it so you do not need to lean over. Keep your chin tucked in. Keep your neck drawn back, and keep your elbows bent at a right angle. Your arms should look like the letter "L."  Sit high and close to the steering wheel when you drive. Add a lumbar support to your car seat, if needed.  Avoid sitting or standing in one position for very long. Take breaks to get up, stretch, and walk around at least one time every hour. Take breaks every hour if you are driving for long periods of time.  Sleep on your side with your knees slightly bent, or sleep on your back with a pillow under your knees. Do not lie on the front of your body to sleep. WHAT SHOULD I KNOW ABOUT LIFTING, TWISTING, AND REACHING? Lifting and Heavy Lifting  Avoid heavy lifting, especially repetitive heavy lifting. If you must do heavy lifting:  Stretch before lifting.  Work slowly.  Rest between lifts.  Use a tool such as a cart or a dolly to move objects if one is available.  Make several small trips instead of carrying one heavy load.  Ask for help when you need it, especially when moving big objects.  Follow these steps when lifting:  Stand with your feet shoulder-width apart.  Get as close to the object as you can. Do not try to pick up a heavy  object that is far from your body.  Use handles or lifting straps if they are available.  Bend at your knees. Squat down, but keep your heels off the floor.  Keep your shoulders pulled back, your chin tucked in, and your back straight.  Lift the object slowly while you tighten the muscles in your legs, abdomen, and buttocks. Keep the object as close to the center of your body as possible.  Follow these steps when putting down a heavy load:  Stand with your feet shoulder-width apart.  Lower the object slowly while you tighten the muscles in your legs, abdomen, and buttocks. Keep the object as  close to the center of your body as possible.  Keep your shoulders pulled back, your chin tucked in, and your back straight.  Bend at your knees. Squat down, but keep your heels off the floor.  Use handles or lifting straps if they are available. Twisting and Reaching  Avoid lifting heavy objects above your waist.  Do not twist at your waist while you are lifting or carrying a load. If you need to turn, move your feet.  Do not bend over without bending at your knees.  Avoid reaching over your head, across a table, or for an object on a high surface. WHAT ARE SOME OTHER TIPS?  Avoid wet floors and icy ground. Keep sidewalks clear of ice to prevent falls.  Do not sleep on a mattress that is too soft or too hard.  Keep items that are used frequently within easy reach.  Put heavier objects on shelves at waist level, and put lighter objects on lower or higher shelves.  Find ways to decrease your stress, such as exercise, massage, or relaxation techniques. Stress can build up in your muscles. Tense muscles are more vulnerable to injury.  Talk with your health care provider if you feel anxious or depressed. These conditions can make back pain worse.  Wear flat heel shoes with cushioned soles.  Avoid sudden movements.  Use both shoulder straps when carrying a backpack.  Do not use any tobacco products, including cigarettes, chewing tobacco, or electronic cigarettes. If you need help quitting, ask your health care provider.   This information is not intended to replace advice given to you by your health care provider. Make sure you discuss any questions you have with your health care provider.   Document Released: 03/15/2004 Document Revised: 06/22/2014 Document Reviewed: 02/09/2014 Elsevier Interactive Patient Education [employer].
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
## 2015-06-18 NOTE — ED Notes (Signed)
Pt c/o right sided low back pain since yesterday.  Pt was lifting a dresser and in the process of putting it down when she started having pain.  Pain went away then came back this morning.

## 2015-07-01 ENCOUNTER — Other Ambulatory Visit: Payer: Self-pay | Admitting: Family Medicine

## 2015-07-01 NOTE — Telephone Encounter (Signed)
Medication filled to pharmacy as requested.   

## 2015-08-05 ENCOUNTER — Telehealth: Payer: Self-pay | Admitting: Behavioral Health

## 2015-08-05 ENCOUNTER — Encounter: Payer: Self-pay | Admitting: Behavioral Health

## 2015-08-05 NOTE — Telephone Encounter (Signed)
Pre-Visit Call completed with patient and chart updated.   Pre-Visit Info documented in Specialty Comments under [employer].    

## 2015-08-08 ENCOUNTER — Ambulatory Visit: Payer: 59 | Admitting: Physician Assistant

## 2015-08-08 DIAGNOSIS — Z0289 Encounter for other administrative examinations: Secondary | ICD-10-CM

## 2015-08-09 ENCOUNTER — Telehealth: Payer: Self-pay | Admitting: Family Medicine

## 2015-08-09 NOTE — Telephone Encounter (Signed)
Patient was No Show on 6/19 to see Rosepineody. Has 4 prior No Show's in past year with Tabori.  Charge or No Charge?

## 2015-08-10 ENCOUNTER — Encounter: Payer: Self-pay | Admitting: Physician Assistant

## 2015-08-10 NOTE — Telephone Encounter (Signed)
Pt's appt was to establish w/ Sandy Perez.  I will be happy to dismiss if this is his preference as she missed her new to establish visit but I don't want to overstep

## 2015-08-10 NOTE — Telephone Encounter (Signed)
She didn't miss the appt w/ me, so [facility] on whether she should be charged.  But given the # of no-shows in the last year, I would consider dismissal

## 2015-08-10 NOTE — Telephone Encounter (Signed)
Charge. Will let PCP decide about dismissal.

## 2015-08-11 ENCOUNTER — Encounter: Payer: Self-pay | Admitting: General Practice

## 2015-08-11 NOTE — Telephone Encounter (Signed)
I am fine with the dismissal if you are willing to write. I feel that I cannot write a true dismissal without having seen the patient but I agree that due to her number of no-shows she should be dismissed from [facility].

## 2015-08-11 NOTE — Telephone Encounter (Signed)
Dismissal paperwork completed and letter written. Given to Print production plannerffice manager for further processing.

## 2015-08-11 NOTE — Telephone Encounter (Signed)
Please start dismissal process due to # of no-shows

## 2015-08-12 ENCOUNTER — Telehealth: Payer: Self-pay | Admitting: Family Medicine

## 2015-08-12 NOTE — Telephone Encounter (Signed)
Patient dismissed from [facility] by Neena RhymesKatherine Tabori MD , effective August 11, 2015. Dismissal letter sent out by certified / registered mail.  [facility]  Certified dismissal letter returned as undeliverable, unclaimed, return to sender after three attempts by USPS. Letter placed in another envelope and resent as 1st class mail which does not require a signature. Copy of returned letter scanned under media tab 08/29/15  CLN

## 2015-08-29 NOTE — Telephone Encounter (Signed)
Updated chart to reflect dismissal and removed your name from PCP

## 2015-09-03 DIAGNOSIS — N390 Urinary tract infection, site not specified: Secondary | ICD-10-CM | POA: Diagnosis not present

## 2015-09-03 DIAGNOSIS — J029 Acute pharyngitis, unspecified: Secondary | ICD-10-CM | POA: Diagnosis not present

## 2015-09-15 ENCOUNTER — Telehealth: Payer: Self-pay | Admitting: General Practice

## 2015-09-15 NOTE — Telephone Encounter (Signed)
Patient would like to transfer from Dr. Tabori to Dr.Copland please advise °

## 2015-09-16 ENCOUNTER — Other Ambulatory Visit: Payer: Self-pay | Admitting: Family Medicine

## 2015-09-16 NOTE — Telephone Encounter (Signed)
Looks like this patient is going to be staying at [facility].  Can you please refill?

## 2015-09-16 NOTE — Telephone Encounter (Signed)
ok 

## 2015-09-17 NOTE — Telephone Encounter (Signed)
Ok to switch 

## 2015-09-19 ENCOUNTER — Ambulatory Visit (INDEPENDENT_AMBULATORY_CARE_PROVIDER_SITE_OTHER): Payer: 59 | Admitting: Family Medicine

## 2015-09-19 ENCOUNTER — Encounter: Payer: Self-pay | Admitting: Family Medicine

## 2015-09-19 VITALS — BP 129/90 | HR 93 | Temp 98.0°F | Ht 61.0 in | Wt 255.0 lb

## 2015-09-19 DIAGNOSIS — Z87448 Personal history of other diseases of urinary system: Secondary | ICD-10-CM

## 2015-09-19 DIAGNOSIS — F4323 Adjustment disorder with mixed anxiety and depressed mood: Secondary | ICD-10-CM | POA: Diagnosis not present

## 2015-09-19 DIAGNOSIS — Z87440 Personal history of urinary (tract) infections: Secondary | ICD-10-CM | POA: Diagnosis not present

## 2015-09-19 DIAGNOSIS — N926 Irregular menstruation, unspecified: Secondary | ICD-10-CM

## 2015-09-19 DIAGNOSIS — Z3009 Encounter for other general counseling and advice on contraception: Secondary | ICD-10-CM | POA: Diagnosis not present

## 2015-09-19 LAB — POC URINALSYSI DIPSTICK (AUTOMATED)
Bilirubin, UA: NEGATIVE
Blood, UA: NEGATIVE
GLUCOSE UA: NEGATIVE
Ketones, UA: NEGATIVE
LEUKOCYTES UA: NEGATIVE
Nitrite, UA: NEGATIVE
Spec Grav, UA: 1.025
UROBILINOGEN UA: NEGATIVE
pH, UA: 6

## 2015-09-19 LAB — POCT URINE PREGNANCY: PREG TEST UR: NEGATIVE

## 2015-09-19 MED ORDER — MONO-LINYAH 0.25-35 MG-MCG PO TABS: 1.0000 | ORAL_TABLET | Freq: Every day | ORAL | 3 refills | Status: DC

## 2015-09-19 MED ORDER — ALPRAZOLAM 0.5 MG PO TABS: 0.5000 mg | ORAL_TABLET | Freq: Two times a day (BID) | ORAL | 2 refills | Status: DC | PRN
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
## 2015-09-19 NOTE — Patient Instructions (Signed)
I will culture your urine and let you know if it shows any persistent infection Start back on your birth control pill after you start your next period.  Your pregnancy test is negative today!  If you do not get a period in the next 2 weeks take another home pregnancy test Continue to use the xanax sparingly- as little as you can.  Remember this medication can be habit forming and it causes sedation

## 2015-09-20 NOTE — Telephone Encounter (Signed)
Patient scheduled for 10/19/2015 with Dr. Patsy Lager.

## 2015-09-21 ENCOUNTER — Encounter: Payer: Self-pay | Admitting: Family Medicine

## 2015-09-21 LAB — URINE CULTURE

## 2015-09-22 ENCOUNTER — Telehealth: Payer: Self-pay | Admitting: Family Medicine

## 2015-09-22 NOTE — Telephone Encounter (Signed)
Patient called in to request that we type a letter for her stating that she had a miscarriage in 2014. This is for an appeal with her school on financial aid.

## 2015-09-23 ENCOUNTER — Encounter: Payer: Self-pay | Admitting: Family Medicine

## 2015-09-23 NOTE — Telephone Encounter (Signed)
Ok- will write letter and put on mychart for her to print

## 2015-09-26 ENCOUNTER — Telehealth: Payer: Self-pay | Admitting: Family Medicine

## 2015-09-26 NOTE — Telephone Encounter (Signed)
[facility]ismiss[facility] after two attempts (Certifed mail & 1st class mail). Patient's address was changed. Resend the dismissal letter to the patient using the new address entered in Epic. [facility]26[facility]15 [facility]

## 2015-10-19 ENCOUNTER — Ambulatory Visit: Payer: 59 | Admitting: Family Medicine

## 2015-10-19 ENCOUNTER — Telehealth: Payer: Self-pay

## 2015-10-19 NOTE — Telephone Encounter (Signed)
No charge is fine 

## 2015-10-19 NOTE — Telephone Encounter (Signed)
Pt called in at 2:00 stating that she had an emergency to come up and will have to reschedule her appt. Pt rescheduled with Dr. Carmelia RollerWendling because current (Dr. Patsy Lageropland) next available isn't until October for establishing care.       Should pt be charged for missed appt. ?

## 2015-10-21 ENCOUNTER — Encounter: Payer: Self-pay | Admitting: Family Medicine

## 2015-10-21 ENCOUNTER — Ambulatory Visit (INDEPENDENT_AMBULATORY_CARE_PROVIDER_SITE_OTHER): Payer: 59 | Admitting: Family Medicine

## 2015-10-21 VITALS — BP 128/80 | HR 97 | Temp 98.5°F | Ht 61.0 in | Wt 254.2 lb

## 2015-10-21 DIAGNOSIS — L723 Sebaceous cyst: Secondary | ICD-10-CM

## 2015-10-21 DIAGNOSIS — K219 Gastro-esophageal reflux disease without esophagitis: Secondary | ICD-10-CM

## 2015-10-21 DIAGNOSIS — IMO0002 Reserved for concepts with insufficient information to code with codable children: Secondary | ICD-10-CM

## 2015-10-21 MED ORDER — OMEPRAZOLE 40 MG PO CPDR: 40.0000 mg | DELAYED_RELEASE_CAPSULE | Freq: Every day | ORAL | 1 refills | Status: DC

## 2015-10-21 NOTE — Progress Notes (Signed)
Pre visit review using our clinic review tool, if applicable. No additional management support is needed unless otherwise documented below in the visit note. 

## 2015-10-21 NOTE — Progress Notes (Signed)
Chief Complaint  Patient presents with  . Transitions Of Care    pt has cyst on the lower stomach-drainage(milky and tan) also has odor which started this week-noticed x 4 month  . Medication Refill    on Omeprazole   S:  Pt has hx of GERD. Would like refill of Prilosec which she is well controlled on.  She is also experiencing a draining lesion on her lower stomach. It is starting to have an odor, but no pain or redness. She denies fevers. It has been there for around 4 mo, but started draining a week ago.  ROS:  Const: No fevers Skin: As noted in HPI  BP 128/80 (BP Location: Left Arm, Patient Position: Sitting, Cuff Size: Large)   Pulse 97   Temp 98.5 F (36.9 C) (Oral)   Ht 5\' 1"  (1.549 m)   Wt 254 lb 3.2 oz (115.3 kg)   LMP 09/23/2015 (Approximate)   SpO2 99%   BMI 48.03 kg/m   Gen: Awake, alert, appears stated age Skin: There is a circular cystic structure on the underside of her caudal panniculus. There is no TTP. There is hyperpigmentation and an opening.  A milky substance is able to be expressed. No erythema or crepitus. Lungs: No accessory muscle use. Psych: Age appropriate judgment and insight.  Procedure note; incision and drainage Informed consent obtained. The area was cleaned with betadine. The area was anesthetized with 1.3 mL of 1% lidocaine with epinephrine. Once adequate anesthesia was obtained, a cruciate incision was made with 11 blade scalpel. Approximately 0.1 mL of purulent material with blood was expressed. Loculations were interrupted with a curved hemostat. The area was packed with approximately 7 cm of 0.25 in iodoform gauze. The area was then dressed with gauze. There were no complications noted. The patient tolerated the procedure well.  Gastroesophageal reflux disease, esophagitis presence not specified - Plan: omeprazole (PRILOSEC) 40 MG capsule  Abdominal cyst  Orders as above. Unable to remove capsule. If she has recurrence, will send  to general surgery if it is bothering her.  Remove iodoform gauze in 1 week. Her mother is a Engineer, civil (consulting)nurse and will do this. F/u prn otherwise.  Pt voiced understanding and agreement to the plan.  Jilda RocheNicholas Paul BentWendling

## 2015-10-23 ENCOUNTER — Encounter: Payer: Self-pay | Admitting: Family Medicine

## 2015-10-25 NOTE — Telephone Encounter (Signed)
Called patient and informed her fee was waived and reminded her of the 24hr cancellation policy. Patient stated she understood and was aware she had missed a lot of appointments.

## 2015-10-25 NOTE — Telephone Encounter (Signed)
Ebony-- please review pt mychart request regarding no show charge and advise pt. Thanks!

## 2015-10-26 ENCOUNTER — Encounter: Payer: Self-pay | Admitting: Family Medicine

## 2015-10-26 ENCOUNTER — Ambulatory Visit (INDEPENDENT_AMBULATORY_CARE_PROVIDER_SITE_OTHER): Payer: 59 | Admitting: Family Medicine

## 2015-10-26 VITALS — BP 120/80 | HR 103 | Temp 98.0°F | Ht 61.0 in | Wt 253.6 lb

## 2015-10-26 DIAGNOSIS — Z9109 Other allergy status, other than to drugs and biological substances: Secondary | ICD-10-CM

## 2015-10-26 DIAGNOSIS — Z91048 Other nonmedicinal substance allergy status: Secondary | ICD-10-CM
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
## 2015-10-26 NOTE — Patient Instructions (Addendum)
You could try other kinds of tape. I would recommend using band-aids either as tape or to cover. Try to avoid swimming or baths for now. Showering is preferable. Soap and water with your hands for gentle pressure. Avoid vigorous scrubbing.  I see no signs of infection at this time. Pull the wick as originally planned on Friday. Call us if you continue to have drainage and wish to have a referral to a general surgeon.

## 2015-10-26 NOTE — Progress Notes (Signed)
Pre visit review using our clinic review tool, if applicable. No additional management support is needed unless otherwise documented below in the visit note. 

## 2015-12-12 ENCOUNTER — Ambulatory Visit (INDEPENDENT_AMBULATORY_CARE_PROVIDER_SITE_OTHER): Payer: 59 | Admitting: Family Medicine

## 2015-12-12 ENCOUNTER — Encounter: Payer: Self-pay | Admitting: Family Medicine

## 2015-12-12 ENCOUNTER — Telehealth: Payer: Self-pay | Admitting: Family Medicine

## 2015-12-12 VITALS — BP 124/82 | HR 96 | Temp 98.7°F | Ht 61.0 in | Wt 255.0 lb

## 2015-12-12 DIAGNOSIS — F4323 Adjustment disorder with mixed anxiety and depressed mood: Secondary | ICD-10-CM | POA: Diagnosis not present

## 2015-12-12 DIAGNOSIS — Z23 Encounter for immunization: Secondary | ICD-10-CM

## 2015-12-12 DIAGNOSIS — R1013 Epigastric pain: Secondary | ICD-10-CM | POA: Diagnosis not present

## 2015-12-12 LAB — CBC
HCT: 39.7 % (ref 36.0–46.0)
Hemoglobin: 13.4 g/dL (ref 12.0–15.0)
MCHC: 33.8 g/dL (ref 30.0–36.0)
MCV: 83.9 fl (ref 78.0–100.0)
Platelets: 267 10*3/uL (ref 150.0–400.0)
RBC: 4.73 Mil/uL (ref 3.87–5.11)
RDW: 14.8 % (ref 11.5–15.5)
WBC: 10.3 10*3/uL (ref 4.0–10.5)

## 2015-12-12 LAB — COMPREHENSIVE METABOLIC PANEL
ALK PHOS: 79 U/L (ref 39–117)
ALT: 29 U/L (ref 0–35)
AST: 20 U/L (ref 0–37)
Albumin: 4.2 g/dL (ref 3.5–5.2)
BILIRUBIN TOTAL: 0.3 mg/dL (ref 0.2–1.2)
BUN: 7 mg/dL (ref 6–23)
CO2: 29 meq/L (ref 19–32)
CREATININE: 0.62 mg/dL (ref 0.40–1.20)
Calcium: 10 mg/dL (ref 8.4–10.5)
Chloride: 104 mEq/L (ref 96–112)
GFR: 125.71 mL/min (ref >60.00)
GLUCOSE: 104 mg/dL — AB (ref 70–99)
Potassium: 4.5 mEq/L (ref 3.5–5.1)
SODIUM: 137 meq/L (ref 135–145)
TOTAL PROTEIN: 7.6 g/dL (ref 6.0–8.3)

## 2015-12-12 MED ORDER — SERTRALINE HCL 50 MG PO TABS: 50.0000 mg | ORAL_TABLET | Freq: Every day | ORAL | 3 refills | Status: DC

## 2015-12-12 MED ORDER — DULOXETINE HCL 30 MG PO CPEP: 30.0000 mg | ORAL_CAPSULE | Freq: Every day | ORAL | 0 refills | Status: DC

## 2015-12-12 NOTE — Progress Notes (Signed)
Pre visit review using our clinic review tool, if applicable. No additional management support is needed unless otherwise documented below in the visit note. 
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
## 2015-12-12 NOTE — Patient Instructions (Addendum)
It was good to see you today!  Please keep me posted on how you are doing with your stomach- we will get some labs today and also an ultrasound of your gallbladder.  If these tests are normal and your symptoms persist we will plan to do a test for H pylori bacteria that can cause ulcers  Please taper off your cymbalta- take the 30 mg capsules once a day for 1-2 weeks, then every other day for a week, then start on 50 mg of zoloft.  We will plan to increase the zoloft as needed.    Please send me an email message in 4 weeks with an update.  If you are getting worse or have any other concerns in the meantime please contact me right away

## 2015-12-12 NOTE — Telephone Encounter (Signed)
Caller name: Relationship to patient: Self Can be reached: [phone]  Pharmacy:  [facility] [place] [address] [phone] (Phone) [phone] (Fax)     Reason for call: Refill on Xanax

## 2015-12-13 ENCOUNTER — Ambulatory Visit (HOSPITAL_BASED_OUTPATIENT_CLINIC_OR_DEPARTMENT_OTHER): Payer: 59

## 2015-12-14 ENCOUNTER — Other Ambulatory Visit: Payer: Self-pay | Admitting: Emergency Medicine

## 2015-12-14 DIAGNOSIS — F4323 Adjustment disorder with mixed anxiety and depressed mood: Secondary | ICD-10-CM

## 2015-12-14 MED ORDER — ALPRAZOLAM 0.5 MG PO TABS: 0.5000 mg | ORAL_TABLET | Freq: Two times a day (BID) | ORAL | 2 refills | Status: DC | PRN

## 2015-12-14 NOTE — Telephone Encounter (Signed)
Received refill request for Alprazolam 0.5 MG tablet [facility] Last office visit 12/12/2015 and last refill 09/19/2015. Is it ok to refill? Please advise.

## 2015-12-15 ENCOUNTER — Ambulatory Visit (HOSPITAL_BASED_OUTPATIENT_CLINIC_OR_DEPARTMENT_OTHER)
Admission: RE | Admit: 2015-12-15 | Discharge: 2015-12-15 | Disposition: A | Discharge status: Home / Self Care | Payer: 59 | Source: Ambulatory Visit | Attending: Family Medicine | Admitting: Family Medicine

## 2015-12-15 ENCOUNTER — Encounter: Payer: Self-pay | Admitting: Family Medicine

## 2015-12-15 DIAGNOSIS — R932 Abnormal findings on diagnostic imaging of liver and biliary tract: Secondary | ICD-10-CM | POA: Insufficient documentation

## 2015-12-15 DIAGNOSIS — R1013 Epigastric pain: Secondary | ICD-10-CM | POA: Diagnosis not present

## 2015-12-15 DIAGNOSIS — K760 Fatty (change of) liver, not elsewhere classified: Secondary | ICD-10-CM | POA: Diagnosis not present

## 2015-12-16 ENCOUNTER — Telehealth: Payer: Self-pay

## 2015-12-16 NOTE — Telephone Encounter (Signed)
[facility] Alprazolam 0.5mg  tablets Take 1 tablet by mouth twice daily as needed for anxiety Last filled: 12/14/15 Amt: 30, 2 Phone in  Request filled as noted above.

## 2016-01-18 ENCOUNTER — Encounter: Payer: Self-pay | Admitting: Family Medicine

## 2016-01-31 ENCOUNTER — Encounter: Payer: Self-pay | Admitting: Family Medicine

## 2016-03-01 ENCOUNTER — Emergency Department (HOSPITAL_COMMUNITY): Payer: No Typology Code available for payment source

## 2016-03-01 ENCOUNTER — Telehealth: Payer: Self-pay | Admitting: Family Medicine

## 2016-03-01 ENCOUNTER — Encounter (HOSPITAL_COMMUNITY): Payer: Self-pay

## 2016-03-01 ENCOUNTER — Emergency Department (HOSPITAL_COMMUNITY)
Admission: EM | Admit: 2016-03-01 | Discharge: 2016-03-01 | Disposition: A | Discharge status: Home / Self Care | Payer: No Typology Code available for payment source | Attending: Emergency Medicine | Admitting: Emergency Medicine

## 2016-03-01 DIAGNOSIS — R112 Nausea with vomiting, unspecified: Secondary | ICD-10-CM

## 2016-03-01 DIAGNOSIS — Y999 Unspecified external cause status: Secondary | ICD-10-CM | POA: Diagnosis not present

## 2016-03-01 DIAGNOSIS — S3991XA Unspecified injury of abdomen, initial encounter: Secondary | ICD-10-CM | POA: Diagnosis not present

## 2016-03-01 DIAGNOSIS — M546 Pain in thoracic spine: Secondary | ICD-10-CM | POA: Diagnosis not present

## 2016-03-01 DIAGNOSIS — R935 Abnormal findings on diagnostic imaging of other abdominal regions, including retroperitoneum: Secondary | ICD-10-CM | POA: Insufficient documentation

## 2016-03-01 DIAGNOSIS — S0990XA Unspecified injury of head, initial encounter: Secondary | ICD-10-CM | POA: Diagnosis not present

## 2016-03-01 DIAGNOSIS — M545 Low back pain: Secondary | ICD-10-CM | POA: Diagnosis not present

## 2016-03-01 DIAGNOSIS — R0781 Pleurodynia: Secondary | ICD-10-CM | POA: Insufficient documentation

## 2016-03-01 DIAGNOSIS — S299XXA Unspecified injury of thorax, initial encounter: Secondary | ICD-10-CM | POA: Diagnosis not present

## 2016-03-01 DIAGNOSIS — S098XXA Other specified injuries of head, initial encounter: Secondary | ICD-10-CM | POA: Diagnosis not present

## 2016-03-01 DIAGNOSIS — Z79899 Other long term (current) drug therapy: Secondary | ICD-10-CM | POA: Insufficient documentation

## 2016-03-01 DIAGNOSIS — M25569 Pain in unspecified knee: Secondary | ICD-10-CM | POA: Diagnosis not present

## 2016-03-01 DIAGNOSIS — R197 Diarrhea, unspecified: Secondary | ICD-10-CM

## 2016-03-01 DIAGNOSIS — Y92410 Unspecified street and highway as the place of occurrence of the external cause: Secondary | ICD-10-CM | POA: Insufficient documentation

## 2016-03-01 DIAGNOSIS — T148XXA Other injury of unspecified body region, initial encounter: Secondary | ICD-10-CM | POA: Diagnosis not present

## 2016-03-01 DIAGNOSIS — S199XXA Unspecified injury of neck, initial encounter: Secondary | ICD-10-CM | POA: Diagnosis not present

## 2016-03-01 DIAGNOSIS — Y939 Activity, unspecified: Secondary | ICD-10-CM | POA: Diagnosis not present

## 2016-03-01 DIAGNOSIS — M5489 Other dorsalgia: Secondary | ICD-10-CM | POA: Diagnosis not present

## 2016-03-01 LAB — COMPREHENSIVE METABOLIC PANEL WITH GFR
ALT: 41 U/L (ref 14–54)
AST: 39 U/L (ref 15–41)
Albumin: 3.8 g/dL (ref 3.5–5.0)
Alkaline Phosphatase: 65 U/L (ref 38–126)
Anion gap: 13 (ref 5–15)
BUN: 6 mg/dL (ref 6–20)
CO2: 21 mmol/L — ABNORMAL LOW (ref 22–32)
Calcium: 10.2 mg/dL (ref 8.9–10.3)
Chloride: 101 mmol/L (ref 101–111)
Creatinine, Ser: 0.79 mg/dL (ref 0.44–1.00)
GFR calc Af Amer: >60 mL/min
GFR calc non Af Amer: >60 mL/min
Glucose, Bld: 101 mg/dL — ABNORMAL HIGH (ref 65–99)
Potassium: 3.1 mmol/L — ABNORMAL LOW (ref 3.5–5.1)
Sodium: 135 mmol/L (ref 135–145)
Total Bilirubin: 0.4 mg/dL (ref 0.3–1.2)
Total Protein: 7.4 g/dL (ref 6.5–8.1)

## 2016-03-01 LAB — I-STAT CHEM 8, ED
BUN: 6 mg/dL (ref 6–20)
Calcium, Ion: 1.31 mmol/L (ref 1.15–1.40)
Chloride: 101 mmol/L (ref 101–111)
Creatinine, Ser: 0.7 mg/dL (ref 0.44–1.00)
Glucose, Bld: 103 mg/dL — ABNORMAL HIGH (ref 65–99)
HCT: 41 % (ref 36.0–46.0)
Hemoglobin: 13.9 g/dL (ref 12.0–15.0)
Potassium: 3.1 mmol/L — ABNORMAL LOW (ref 3.5–5.1)
Sodium: 137 mmol/L (ref 135–145)
TCO2: 23 mmol/L (ref 0–100)

## 2016-03-01 LAB — URINALYSIS, ROUTINE W REFLEX MICROSCOPIC
Bilirubin Urine: NEGATIVE
Glucose, UA: NEGATIVE mg/dL
Hgb urine dipstick: NEGATIVE
Ketones, ur: 80 mg/dL — AB
Leukocytes, UA: NEGATIVE
Nitrite: NEGATIVE
Protein, ur: 30 mg/dL — AB
Specific Gravity, Urine: >1.046 — ABNORMAL HIGH (ref 1.005–1.030)
pH: 6 (ref 5.0–8.0)

## 2016-03-01 LAB — CBC
HCT: 39.5 % (ref 36.0–46.0)
Hemoglobin: 13.3 g/dL (ref 12.0–15.0)
MCH: 27.7 pg (ref 26.0–34.0)
MCHC: 33.7 g/dL (ref 30.0–36.0)
MCV: 82.1 fL (ref 78.0–100.0)
Platelets: 217 10*3/uL (ref 150–400)
RBC: 4.81 MIL/uL (ref 3.87–5.11)
RDW: 15.6 % — ABNORMAL HIGH (ref 11.5–15.5)
WBC: 7.7 10*3/uL (ref 4.0–10.5)

## 2016-03-01 LAB — I-STAT BETA HCG BLOOD, ED (MC, WL, AP ONLY): I-stat hCG, quantitative: <5 m[IU]/mL (ref <5)

## 2016-03-01 MED ORDER — IOPAMIDOL (ISOVUE-300) INJECTION 61%
INTRAVENOUS | Status: AC
  Administered 2016-03-01: 100 mL
  Filled 2016-03-01: qty 100

## 2016-03-01 MED ORDER — SODIUM CHLORIDE 0.9 % IV BOLUS (SEPSIS)
1000.0000 mL | Freq: Once | INTRAVENOUS | Status: AC
  Administered 2016-03-01: 1000 mL via INTRAVENOUS

## 2016-03-01 MED ORDER — ACIDOPHILUS PROBIOTIC 10 MG PO TABS: 10.0000 mg | ORAL_TABLET | Freq: Three times a day (TID) | ORAL | 0 refills | Status: DC

## 2016-03-01 MED ORDER — NAPROXEN 250 MG PO TABS: 250.0000 mg | ORAL_TABLET | Freq: Two times a day (BID) | ORAL | 0 refills | Status: DC

## 2016-03-01 MED ORDER — ONDANSETRON 4 MG PO TBDP: 4.0000 mg | ORAL_TABLET | Freq: Three times a day (TID) | ORAL | 0 refills | Status: DC | PRN

## 2016-03-01 MED ORDER — ONDANSETRON HCL 4 MG/2ML IJ SOLN
4.0000 mg | Freq: Once | INTRAMUSCULAR | Status: AC
  Administered 2016-03-01: 4 mg via INTRAVENOUS
  Filled 2016-03-01: qty 2

## 2016-03-01 MED ORDER — MORPHINE SULFATE (PF) 4 MG/ML IV SOLN
4.0000 mg | Freq: Once | INTRAVENOUS | Status: AC
  Administered 2016-03-01: 4 mg via INTRAVENOUS
  Filled 2016-03-01: qty 1

## 2016-03-01 MED ORDER — SODIUM CHLORIDE 0.9 % IV SOLN: INTRAVENOUS | Status: DC

## 2016-03-01 NOTE — Telephone Encounter (Signed)
Message routed to PCP.

## 2016-03-01 NOTE — Telephone Encounter (Signed)
Patient Name: Sandy Perez  DOB: 1990/06/01    Initial Comment Caller states she has diarrhea and vomiting for past two days, can't keep water and medicine down. She is willing to see anyone today to get relief.    Nurse Assessment  Nurse: Stefano GaulStringer, RN, Dwana CurdVera Date/Time Lamount Cohen(Eastern Time): 03/01/2016 2:37:20 PM  Confirm and document reason for call. If symptomatic, describe symptoms. ---Caller states she has had vomiting and diarrhea for the past 2 weeks. Anti diarrhea medication helps some. She is vomiting up water and pepto bismol. Has not had a chance to take her temp. She has urinated. has hot flashes and chills. vomiting and diarrhea get worse at night.  Does the patient have any new or worsening symptoms? ---Yes  Will a triage be completed? ---Yes  Related visit to physician within the last 2 weeks? ---No  Does the PT have any chronic conditions? (i.e. diabetes, asthma, etc.) ---No  Is the patient pregnant or possibly pregnant? (Ask all females between the ages of 1012-55) ---No  Is this a behavioral health or substance abuse call? ---No     Guidelines    Guideline Title Affirmed Question Affirmed Notes  Vomiting [1] SEVERE vomiting (e.g., 6 or more times/day) AND [2] present > 8 hours    Final Disposition User   Go to ED Now (or PCP triage) Stefano GaulStringer, RN, Vera    Comments  pt states that her daughter has woken up vomiting and she needs to call her pediatrician. Does not want to make appt at this time.  called office and spoke to receptionist and gave report that pt has been vomiting all night with triage outcome of go to ER now or (PCP triage). Was looking for her an appt when her daughter woke up and was vomiting. Said she had to take care of her daughter and could not make appt.   Referrals  GO TO FACILITY REFUSED   Disagree/Comply: Comply
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
## 2016-03-01 NOTE — ED Notes (Signed)
Pt transported to CT ?
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
## 2016-03-01 NOTE — Telephone Encounter (Signed)
Pt did go to ED

## 2016-03-29 ENCOUNTER — Telehealth: Payer: Self-pay | Admitting: Family Medicine

## 2016-03-29 DIAGNOSIS — Z20828 Contact with and (suspected) exposure to other viral communicable diseases: Secondary | ICD-10-CM

## 2016-03-29 MED ORDER — OSELTAMIVIR PHOSPHATE 75 MG PO CAPS: 75.0000 mg | ORAL_CAPSULE | Freq: Every day | ORAL | 0 refills | Status: DC

## 2016-03-29 NOTE — Telephone Encounter (Signed)
pts husband has the flu- she would like to have a tamiflu rx.  Will send her rx to

## 2016-05-09 DIAGNOSIS — H5213 Myopia, bilateral: Secondary | ICD-10-CM | POA: Diagnosis not present

## 2016-06-01 ENCOUNTER — Other Ambulatory Visit: Payer: Self-pay | Admitting: Family Medicine

## 2016-06-01 DIAGNOSIS — K219 Gastro-esophageal reflux disease without esophagitis: Secondary | ICD-10-CM

## 2016-06-01 NOTE — Telephone Encounter (Signed)
Rx sent to the pharmacy by e-script.//AB/CMA 

## 2016-06-08 ENCOUNTER — Ambulatory Visit: Payer: 59 | Admitting: Medical

## 2016-06-11 ENCOUNTER — Encounter: Payer: Self-pay | Admitting: Family

## 2016-06-11 ENCOUNTER — Ambulatory Visit (INDEPENDENT_AMBULATORY_CARE_PROVIDER_SITE_OTHER): Payer: 59 | Admitting: Family

## 2016-06-11 VITALS — BP 143/91 | HR 81 | Temp 98.1°F | Resp 16 | Ht 61.0 in | Wt 249.2 lb

## 2016-06-11 DIAGNOSIS — G43909 Migraine, unspecified, not intractable, without status migrainosus: Secondary | ICD-10-CM | POA: Diagnosis not present

## 2016-06-11 DIAGNOSIS — N6310 Unspecified lump in the right breast, unspecified quadrant: Secondary | ICD-10-CM | POA: Diagnosis not present

## 2016-06-11 DIAGNOSIS — R030 Elevated blood-pressure reading, without diagnosis of hypertension: Secondary | ICD-10-CM | POA: Diagnosis not present

## 2016-06-11 MED ORDER — RIZATRIPTAN BENZOATE 5 MG PO TABS: 5.0000 mg | ORAL_TABLET | ORAL | 5 refills | Status: DC | PRN

## 2016-06-11 MED ORDER — ONDANSETRON 4 MG PO TBDP: 4.0000 mg | ORAL_TABLET | Freq: Three times a day (TID) | ORAL | 0 refills | Status: DC | PRN

## 2016-06-11 NOTE — Patient Instructions (Signed)
Try to limit your use of tyenol. For migraine- please take 1 tablet of maxalt at start of migraine. May repeat in 2 hours if headache not resolved. Max 2 tabs/24 hours. Let us know if you have any issues tolerating maxalt. For nausea you may use zofran. You will be contacted about your mammogram and ultrasound to evaluate the mass in your right breast. Please let me know if you have not heard back about this appointment in 1 week.
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
## 2016-06-11 NOTE — Progress Notes (Signed)
Pre visit review using our clinic review tool, if applicable. No additional management support is needed unless otherwise documented below in the visit note. 

## 2016-06-21 ENCOUNTER — Other Ambulatory Visit: Payer: Self-pay | Admitting: Family Medicine

## 2016-06-21 ENCOUNTER — Telehealth: Payer: Self-pay | Admitting: Family Medicine

## 2016-06-21 DIAGNOSIS — F4323 Adjustment disorder with mixed anxiety and depressed mood: Secondary | ICD-10-CM

## 2016-06-21 MED ORDER — ALPRAZOLAM 0.5 MG PO TABS: 0.5000 mg | ORAL_TABLET | Freq: Two times a day (BID) | ORAL | 2 refills | Status: DC | PRN

## 2016-06-21 NOTE — Progress Notes (Signed)
Reviewed her NCCSR- she got her last fill on 04/27/16.  Will fill for her today

## 2016-06-21 NOTE — Telephone Encounter (Signed)
Taken care of

## 2016-06-21 NOTE — Telephone Encounter (Signed)
Caller name: [name] Relation to pt: self Call back number: [phone] Pharmacy: [facility]  Reason for call: Pt came in office stating went to pharmacy to see if she still had rx for Xanax for her and pharmacist states does not, pt is mentioning if possible to get rx today since she is in office and that she is having final examen (feeling stress) and does not have any meds left. If possible to print rx for Xanax. Please advise.

## 2016-06-22 ENCOUNTER — Ambulatory Visit
Admission: RE | Admit: 2016-06-22 | Discharge: 2016-06-22 | Disposition: A | Discharge status: Home / Self Care | Payer: 59 | Source: Ambulatory Visit | Attending: Family | Admitting: Family

## 2016-06-22 DIAGNOSIS — N6310 Unspecified lump in the right breast, unspecified quadrant: Secondary | ICD-10-CM

## 2016-06-22 DIAGNOSIS — N6001 Solitary cyst of right breast: Secondary | ICD-10-CM | POA: Diagnosis not present

## 2016-07-06 ENCOUNTER — Encounter: Payer: Self-pay | Admitting: Family Medicine

## 2016-07-11 ENCOUNTER — Ambulatory Visit (INDEPENDENT_AMBULATORY_CARE_PROVIDER_SITE_OTHER): Payer: 59 | Admitting: Family Medicine

## 2016-07-11 VITALS — BP 122/86 | HR 88 | Temp 98.8°F | Ht 60.5 in | Wt 249.0 lb

## 2016-07-11 DIAGNOSIS — F4323 Adjustment disorder with mixed anxiety and depressed mood: Secondary | ICD-10-CM | POA: Diagnosis not present

## 2016-07-11 DIAGNOSIS — B029 Zoster without complications: Secondary | ICD-10-CM

## 2016-07-11 DIAGNOSIS — E876 Hypokalemia: Secondary | ICD-10-CM

## 2016-07-11 LAB — BASIC METABOLIC PANEL
BUN: 7 mg/dL (ref 6–23)
CO2: 25 mEq/L (ref 19–32)
Calcium: 9.9 mg/dL (ref 8.4–10.5)
Chloride: 106 mEq/L (ref 96–112)
Creatinine, Ser: 0.63 mg/dL (ref 0.40–1.20)
GFR: 122.81 mL/min (ref >60.00)
Glucose, Bld: 105 mg/dL — ABNORMAL HIGH (ref 70–99)
POTASSIUM: 4.5 meq/L (ref 3.5–5.1)
SODIUM: 137 meq/L (ref 135–145)

## 2016-07-11 LAB — CBC
HCT: 38.3 % (ref 36.0–46.0)
Hemoglobin: 12.6 g/dL (ref 12.0–15.0)
MCHC: 33 g/dL (ref 30.0–36.0)
MCV: 84.3 fl (ref 78.0–100.0)
PLATELETS: 259 10*3/uL (ref 150.0–400.0)
RBC: 4.55 Mil/uL (ref 3.87–5.11)
RDW: 17.3 % — ABNORMAL HIGH (ref 11.5–15.5)
WBC: 7.1 10*3/uL (ref 4.0–10.5)

## 2016-07-11 LAB — HIV ANTIBODY (ROUTINE TESTING W REFLEX): HIV: NONREACTIVE

## 2016-07-11 MED ORDER — VALACYCLOVIR HCL 1 G PO TABS: 1000.0000 mg | ORAL_TABLET | Freq: Three times a day (TID) | ORAL | 0 refills | Status: DC

## 2016-07-11 MED ORDER — SERTRALINE HCL 100 MG PO TABS: 200.0000 mg | ORAL_TABLET | Freq: Every day | ORAL | 6 refills | Status: DC

## 2016-07-11 NOTE — Telephone Encounter (Deleted)
Hello [name],   Yes! We would be able to continuing seeing you here at our office. We are not currently accepting new [place] Medicaid patients however will will continue to treat our current patients that make the transition. Let me know if you have any other question.   Thanks [employer]  Office Supervisor.

## 2016-07-11 NOTE — Patient Instructions (Addendum)
You may have shingles-  I cannot say for certain,but the distribution of the rash on your skin is certainly suspicious   Avoid contact with pregnant women of babies under 25 yo until your rash is healed.  We are going to treat you with valtrex for possible shingles- take this three times a day for 1 week Let's try increasing your zoloft to 200 mg to see if this will help with some of your anxiety.   Take 1.5 tabs a day for two weeks, then increase to 2 tabs a day (100 mg each)   I will also make sure that your potassium is ok, and that you immune system looks good since you have apparent shingles at a young age  Please come and see me in about 3 months to see how you are doing. If you like, counseling may also be a helpful option for you to talk about your life and discuss solutions to any problems.    Let me know if you are not doing ok and take care!  I'll be in touch with your labs
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
## 2016-08-17 ENCOUNTER — Other Ambulatory Visit: Payer: Self-pay | Admitting: Family Medicine
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
## 2016-09-03 ENCOUNTER — Ambulatory Visit (INDEPENDENT_AMBULATORY_CARE_PROVIDER_SITE_OTHER): Payer: Medicaid Other | Admitting: Family Medicine

## 2016-09-03 VITALS — BP 131/87 | HR 87 | Temp 98.6°F | Ht 60.5 in | Wt 245.0 lb

## 2016-09-03 DIAGNOSIS — Z638 Other specified problems related to primary support group: Secondary | ICD-10-CM

## 2016-09-03 DIAGNOSIS — F4323 Adjustment disorder with mixed anxiety and depressed mood: Secondary | ICD-10-CM

## 2016-09-03 NOTE — Patient Instructions (Signed)
It was good to see you today- I am sorry that you are having such a hard time with life stressors right now.  Please do consider scheduling a counseling appointment with the counselor of your choice I agree that this would be a great idea for Sandy Perez as well, and you might eventually do some joint counseling session for you both together   Let me know if you are not doing ok, or if I can help in any other way

## 2016-09-13 ENCOUNTER — Other Ambulatory Visit: Payer: Self-pay | Admitting: Emergency Medicine

## 2016-09-13 ENCOUNTER — Telehealth: Payer: Self-pay | Admitting: Emergency Medicine

## 2016-09-13 DIAGNOSIS — F4323 Adjustment disorder with mixed anxiety and depressed mood: Secondary | ICD-10-CM

## 2016-09-13 NOTE — Telephone Encounter (Signed)
Requesting: ALPRAZolam (XANAX) 0.5 MG tablet Contract:  UDS Last OV: 09/03/16 Last Refill: 06/21/16  Please Advise

## 2016-09-14 MED ORDER — ALPRAZOLAM 0.5 MG PO TABS: 0.5000 mg | ORAL_TABLET | Freq: Two times a day (BID) | ORAL | 2 refills | Status: DC | PRN

## 2016-09-14 NOTE — Telephone Encounter (Signed)
I saw this after hours. It seems reasonable to give prescription.. But I am not in office to sign script. Will you pass request along to Doc of day today.

## 2016-09-14 NOTE — Telephone Encounter (Signed)
Rx singed and faxed to pharmacy  

## 2016-09-17 ENCOUNTER — Telehealth: Payer: Self-pay | Admitting: Family Medicine

## 2016-09-17 NOTE — Telephone Encounter (Signed)
Patient aware Rx is ready for her to P/U at [facility]/thx dmf, RMA/AB/CMA

## 2016-09-17 NOTE — Telephone Encounter (Signed)
[place] - [facility]  Called in she said that they never received Rx for ALPRAZolam  She would like to know if Rx can resent?

## 2016-09-25 ENCOUNTER — Encounter: Payer: Self-pay | Admitting: Family Medicine

## 2016-10-30 ENCOUNTER — Other Ambulatory Visit: Payer: Self-pay | Admitting: Family Medicine

## 2016-10-30 DIAGNOSIS — Z3009 Encounter for other general counseling and advice on contraception: Secondary | ICD-10-CM

## 2016-11-02 ENCOUNTER — Other Ambulatory Visit: Payer: Self-pay | Admitting: Emergency Medicine

## 2016-11-02 DIAGNOSIS — F4323 Adjustment disorder with mixed anxiety and depressed mood: Secondary | ICD-10-CM

## 2016-11-02 MED ORDER — ALPRAZOLAM 0.5 MG PO TABS: 0.5000 mg | ORAL_TABLET | Freq: Two times a day (BID) | ORAL | 2 refills | Status: DC | PRN

## 2016-11-02 NOTE — Telephone Encounter (Signed)
NCCSR;it looks like she has refills of xanax at pharmacy However I called [facility] and she does not have RF- authorized for her today

## 2016-11-02 NOTE — Telephone Encounter (Signed)
Requesting: ALPRAZolam (XANAX0 0.5 MG tablet Contract UDS Last OV: 09/03/16 Last Refill: 09/14/16  Please Advise

## 2016-11-08 ENCOUNTER — Ambulatory Visit (INDEPENDENT_AMBULATORY_CARE_PROVIDER_SITE_OTHER): Payer: Medicaid Other | Admitting: Family Medicine

## 2016-11-08 VITALS — BP 140/80 | HR 96 | Temp 99.0°F | Ht 60.5 in | Wt 249.8 lb

## 2016-11-08 DIAGNOSIS — F424 Excoriation (skin-picking) disorder: Secondary | ICD-10-CM

## 2016-11-08 DIAGNOSIS — Z111 Encounter for screening for respiratory tuberculosis: Secondary | ICD-10-CM | POA: Diagnosis not present

## 2016-11-08 DIAGNOSIS — Z021 Encounter for pre-employment examination: Secondary | ICD-10-CM | POA: Diagnosis not present

## 2016-11-08 MED ORDER — DOXYCYCLINE HYCLATE 100 MG PO CAPS: 100.0000 mg | ORAL_CAPSULE | Freq: Two times a day (BID) | ORAL | 0 refills | Status: DC

## 2016-11-08 NOTE — Patient Instructions (Addendum)
It was good to see you today- I am excited for your new job!  Congratulations and take care We are going to treat you with doxycycline for 10 days for your skin infections. I agree that skin picking is the likely cause of these lesions.  This is a difficult habit to break, but you can do it!  Remind yourself every time you "catch yourself" picking of the reasons you do not want to pick.  Keep both hands on the wheel while driving so you can't pick, and you might keep something in your hands when watching TV, etc. Breaking this habit will be a process, so be patient with yourself   We will use doxycycline for 10 days to help clear up the cyst on your behind. Take it twice a day with water- remember this could affect your birth control pill so I would recommend that you use a back up method
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
## 2016-12-07 ENCOUNTER — Other Ambulatory Visit: Payer: Self-pay | Admitting: Family Medicine

## 2016-12-07 DIAGNOSIS — K219 Gastro-esophageal reflux disease without esophagitis: Secondary | ICD-10-CM

## 2017-01-14 ENCOUNTER — Ambulatory Visit: Payer: Medicaid Other | Admitting: Family Medicine

## 2017-01-14 ENCOUNTER — Encounter: Payer: Self-pay | Admitting: Family Medicine

## 2017-01-14 ENCOUNTER — Ambulatory Visit (INDEPENDENT_AMBULATORY_CARE_PROVIDER_SITE_OTHER): Payer: Medicaid Other | Admitting: Family Medicine

## 2017-01-14 VITALS — BP 143/99 | HR 79 | Temp 98.5°F | Ht 60.0 in | Wt 255.4 lb

## 2017-01-14 DIAGNOSIS — B349 Viral infection, unspecified: Secondary | ICD-10-CM | POA: Diagnosis not present

## 2017-01-14 DIAGNOSIS — R030 Elevated blood-pressure reading, without diagnosis of hypertension: Secondary | ICD-10-CM
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
## 2017-01-14 NOTE — Progress Notes (Signed)
Pre visit review using our clinic review tool, if applicable. No additional management support is needed unless otherwise documented below in the visit note. 

## 2017-01-14 NOTE — Patient Instructions (Addendum)
Start checking your blood pressures at home. If >139 on the top or >89 on the bottom, schedule an appt with Dr. Patsy Lageropland.   Keep a clean diet and get your exercise.   Continue to push fluids, practice good hand hygiene, and cover your mouth if you cough.  If you start having fevers, shaking or shortness of breath, seek immediate care.
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
## 2017-01-16 ENCOUNTER — Encounter: Payer: Self-pay | Admitting: Family Medicine

## 2017-02-14 ENCOUNTER — Other Ambulatory Visit: Payer: Self-pay

## 2017-02-14 DIAGNOSIS — F4323 Adjustment disorder with mixed anxiety and depressed mood: Secondary | ICD-10-CM

## 2017-02-14 MED ORDER — ALPRAZOLAM 0.5 MG PO TABS: 0.5000 mg | ORAL_TABLET | Freq: Two times a day (BID) | ORAL | 2 refills | Status: DC | PRN

## 2017-02-14 NOTE — Telephone Encounter (Signed)
Requesting: XANAX 0.5 MG Contract: NO UDS: NO Last OV:01/14/17 Next OV: NOT SCHEDULED  Last Refill: 01/14/17   Please advise

## 2017-02-14 NOTE — Telephone Encounter (Signed)
Reviewed NCCSR- ok to refill  

## 2017-03-04 ENCOUNTER — Ambulatory Visit: Payer: 59 | Admitting: Family Medicine

## 2017-03-04 ENCOUNTER — Ambulatory Visit (INDEPENDENT_AMBULATORY_CARE_PROVIDER_SITE_OTHER): Payer: Medicaid Other | Admitting: Family Medicine

## 2017-03-04 ENCOUNTER — Telehealth: Payer: Self-pay | Admitting: Family Medicine

## 2017-03-04 ENCOUNTER — Encounter: Payer: Self-pay | Admitting: Family Medicine

## 2017-03-04 VITALS — BP 124/82 | HR 79 | Temp 98.4°F | Ht 64.0 in | Wt 251.2 lb

## 2017-03-04 DIAGNOSIS — H6591 Unspecified nonsuppurative otitis media, right ear: Secondary | ICD-10-CM | POA: Diagnosis not present

## 2017-03-04 DIAGNOSIS — H6982 Other specified disorders of Eustachian tube, left ear: Secondary | ICD-10-CM

## 2017-03-04 MED ORDER — PREDNISONE 20 MG PO TABS: 40.0000 mg | ORAL_TABLET | Freq: Every day | ORAL | 0 refills | Status: AC

## 2017-03-04 NOTE — Telephone Encounter (Signed)
Please disregard previous message asking for a reschedule. I was able to reschedule another pt.  If pt calls in, please ask them to be here 10-15 minutes early to check in.  Thanks!

## 2017-03-04 NOTE — Telephone Encounter (Signed)
Tried to call pt to reschedule for a different time/provider. [facility] double booked Dr. Leretha PolSantiago for her 1:20 slot.  Couldn't leave message - no DPR  If she calls back, please try and reschedule her with Barnett AbuWiseman for a different time.
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
## 2017-03-04 NOTE — Patient Instructions (Signed)
Continue to push fluids, practice good hand hygiene, and cover your mouth if you cough.  If you start having fevers, shaking or shortness of breath, seek immediate care.  Let us know if you need anything.  

## 2017-03-04 NOTE — Progress Notes (Signed)
Pre visit review using our clinic review tool, if applicable. No additional management support is needed unless otherwise documented below in the visit note. 

## 2017-03-08 ENCOUNTER — Other Ambulatory Visit: Payer: Self-pay | Admitting: Family Medicine

## 2017-03-29 ENCOUNTER — Other Ambulatory Visit: Payer: Self-pay | Admitting: Family Medicine

## 2017-03-29 DIAGNOSIS — F4323 Adjustment disorder with mixed anxiety and depressed mood: Secondary | ICD-10-CM

## 2017-05-03 ENCOUNTER — Emergency Department (HOSPITAL_BASED_OUTPATIENT_CLINIC_OR_DEPARTMENT_OTHER)
Admission: EM | Admit: 2017-05-03 | Discharge: 2017-05-03 | Disposition: A | Discharge status: Home / Self Care | Payer: Medicaid Other | Attending: Emergency Medicine | Admitting: Emergency Medicine

## 2017-05-03 ENCOUNTER — Emergency Department (HOSPITAL_BASED_OUTPATIENT_CLINIC_OR_DEPARTMENT_OTHER): Payer: Medicaid Other

## 2017-05-03 ENCOUNTER — Encounter (HOSPITAL_BASED_OUTPATIENT_CLINIC_OR_DEPARTMENT_OTHER): Payer: Self-pay | Admitting: *Deleted

## 2017-05-03 ENCOUNTER — Other Ambulatory Visit: Payer: Self-pay

## 2017-05-03 DIAGNOSIS — Z79899 Other long term (current) drug therapy: Secondary | ICD-10-CM | POA: Diagnosis not present

## 2017-05-03 DIAGNOSIS — H6692 Otitis media, unspecified, left ear: Secondary | ICD-10-CM | POA: Insufficient documentation

## 2017-05-03 DIAGNOSIS — R05 Cough: Secondary | ICD-10-CM | POA: Diagnosis present

## 2017-05-03 DIAGNOSIS — R059 Cough, unspecified: Secondary | ICD-10-CM

## 2017-05-03 MED ORDER — AMOXICILLIN-POT CLAVULANATE 875-125 MG PO TABS: 1.0000 | ORAL_TABLET | Freq: Two times a day (BID) | ORAL | 0 refills | Status: DC

## 2017-05-03 MED ORDER — BENZONATATE 100 MG PO CAPS: 100.0000 mg | ORAL_CAPSULE | Freq: Three times a day (TID) | ORAL | 0 refills | Status: DC

## 2017-05-03 NOTE — ED Triage Notes (Signed)
PT HAS BEEN COUGHING FOR 2 WEEKS, IT IS NOW WORSE.
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
## 2017-05-03 NOTE — ED Notes (Signed)
NAD at this time. Pt is stable and going home.  

## 2017-05-03 NOTE — Discharge Instructions (Signed)
You were evaluated in the emergency department for persistent cough and left ear pain.  Your chest x-ray did not show an obvious sign of pneumonia.  We are treating you with an antibiotic for an ear infection and if you have a bacterial bronchitis this will also help that.  Also are prescribing a cough suppressant.  Consider trying your inhaler as this may also help with your cough.  You should follow-up with your primary care doctor and return if any worsening symptoms.
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
## 2017-05-13 ENCOUNTER — Ambulatory Visit (INDEPENDENT_AMBULATORY_CARE_PROVIDER_SITE_OTHER): Payer: Medicaid Other | Admitting: Family Medicine

## 2017-05-13 ENCOUNTER — Encounter: Payer: Self-pay | Admitting: Family Medicine

## 2017-05-13 VITALS — BP 141/88 | HR 94 | Resp 16 | Ht 65.0 in | Wt 249.0 lb

## 2017-05-13 DIAGNOSIS — H6592 Unspecified nonsuppurative otitis media, left ear: Secondary | ICD-10-CM | POA: Diagnosis not present

## 2017-05-13 DIAGNOSIS — H65195 Other acute nonsuppurative otitis media, recurrent, left ear: Secondary | ICD-10-CM

## 2017-05-13 MED ORDER — PREDNISONE 20 MG PO TABS: ORAL_TABLET | ORAL | 0 refills | Status: DC

## 2017-05-13 NOTE — Patient Instructions (Signed)
Good to see you today!   We will have you use prednisone for 3 days, then can take for 3 days more if needed I am going to refer you to ENT asap Please let me know if your symptoms are not better in the next 1-2 days- Sooner if worse.   congrats on your new home!

## 2017-05-21 ENCOUNTER — Other Ambulatory Visit: Payer: Self-pay | Admitting: Family Medicine

## 2017-05-21 DIAGNOSIS — K219 Gastro-esophageal reflux disease without esophagitis: Secondary | ICD-10-CM
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
## 2017-06-13 ENCOUNTER — Ambulatory Visit (INDEPENDENT_AMBULATORY_CARE_PROVIDER_SITE_OTHER): Payer: Self-pay | Admitting: Family Medicine

## 2017-06-13 ENCOUNTER — Inpatient Hospital Stay (HOSPITAL_COMMUNITY)
Admission: AD | Admit: 2017-06-13 | Discharge: 2017-06-13 | Disposition: A | Discharge status: Home / Self Care | Payer: Medicaid Other | Source: Ambulatory Visit | Attending: Obstetrics and Gynecology | Admitting: Obstetrics and Gynecology

## 2017-06-13 ENCOUNTER — Encounter: Payer: Self-pay | Admitting: Family Medicine

## 2017-06-13 VITALS — BP 131/82 | HR 112 | Temp 98.1°F | Ht 65.0 in | Wt 250.4 lb

## 2017-06-13 DIAGNOSIS — Z3201 Encounter for pregnancy test, result positive: Secondary | ICD-10-CM

## 2017-06-13 DIAGNOSIS — N926 Irregular menstruation, unspecified: Secondary | ICD-10-CM

## 2017-06-13 LAB — POCT URINE PREGNANCY: Preg Test, Ur: POSITIVE — AB

## 2017-06-13 NOTE — MAU Note (Signed)
NOT IN LOBBY 

## 2017-06-13 NOTE — Patient Instructions (Signed)
congratulations on your pregnancy!  [facility]- they will do an ultrasound for you to make sure you do NOT have a tubal pregnancy, and to help us determine your dates.  Also please be sure to mention that you have Medicaid so they can help you get set up with pre-natal care

## 2017-06-21 ENCOUNTER — Encounter (HOSPITAL_COMMUNITY): Payer: Self-pay | Admitting: *Deleted

## 2017-06-21 ENCOUNTER — Inpatient Hospital Stay (HOSPITAL_COMMUNITY)
Admission: AD | Admit: 2017-06-21 | Discharge: 2017-06-21 | Disposition: A | Discharge status: Home / Self Care | Payer: Medicaid Other | Source: Ambulatory Visit | Attending: Obstetrics & Gynecology | Admitting: Obstetrics & Gynecology

## 2017-06-21 ENCOUNTER — Inpatient Hospital Stay (HOSPITAL_COMMUNITY): Payer: Medicaid Other

## 2017-06-21 DIAGNOSIS — Z6791 Unspecified blood type, Rh negative: Secondary | ICD-10-CM | POA: Diagnosis not present

## 2017-06-21 DIAGNOSIS — R109 Unspecified abdominal pain: Secondary | ICD-10-CM | POA: Diagnosis present

## 2017-06-21 DIAGNOSIS — O26891 Other specified pregnancy related conditions, first trimester: Secondary | ICD-10-CM | POA: Diagnosis not present

## 2017-06-21 DIAGNOSIS — O30041 Twin pregnancy, dichorionic/diamniotic, first trimester: Secondary | ICD-10-CM | POA: Diagnosis not present

## 2017-06-21 DIAGNOSIS — Z3A01 Less than 8 weeks gestation of pregnancy: Secondary | ICD-10-CM | POA: Diagnosis not present

## 2017-06-21 DIAGNOSIS — O26851 Spotting complicating pregnancy, first trimester: Secondary | ICD-10-CM | POA: Insufficient documentation

## 2017-06-21 DIAGNOSIS — O209 Hemorrhage in early pregnancy, unspecified: Secondary | ICD-10-CM

## 2017-06-21 LAB — CBC
HEMATOCRIT: 35.2 % — AB (ref 36.0–46.0)
Hemoglobin: 11.9 g/dL — ABNORMAL LOW (ref 12.0–15.0)
MCH: 27.4 pg (ref 26.0–34.0)
MCHC: 33.8 g/dL (ref 30.0–36.0)
MCV: 81.1 fL (ref 78.0–100.0)
PLATELETS: 229 10*3/uL (ref 150–400)
RBC: 4.34 MIL/uL (ref 3.87–5.11)
RDW: 17.3 % — AB (ref 11.5–15.5)
WBC: 7.7 10*3/uL (ref 4.0–10.5)

## 2017-06-21 LAB — URINALYSIS, ROUTINE W REFLEX MICROSCOPIC
BILIRUBIN URINE: NEGATIVE
Glucose, UA: NEGATIVE mg/dL
HGB URINE DIPSTICK: NEGATIVE
KETONES UR: NEGATIVE mg/dL
NITRITE: NEGATIVE
PROTEIN: 30 mg/dL — AB
Specific Gravity, Urine: 1.009 (ref 1.005–1.030)
pH: 8 (ref 5.0–8.0)

## 2017-06-21 LAB — HCG, QUANTITATIVE, PREGNANCY: hCG, Beta Chain, Quant, S: 84573 m[IU]/mL — ABNORMAL HIGH (ref <5)

## 2017-06-21 MED ORDER — RHO D IMMUNE GLOBULIN 1500 UNIT/2ML IJ SOSY
300.0000 ug | PREFILLED_SYRINGE | Freq: Once | INTRAMUSCULAR | Status: AC
  Administered 2017-06-21: 300 ug via INTRAMUSCULAR
  Filled 2017-06-21: qty 2

## 2017-06-21 NOTE — MAU Note (Addendum)
Pt presents to MAU with complaints of lower abdominal cramping that started this morning with pink vaginal discharge. Had an U/S was done last Thursday at the pregnancy care center and was told she had two gestational sacs. Was told she needs an internal ultrasound and they do not do those at that faciltiy
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
## 2017-06-21 NOTE — Discharge Instructions (Signed)
Multiple Pregnancy Having a multiple pregnancy means that a woman is carrying more than one baby at a time. She may be pregnant with twins, triplets, or more. The majority of multiple pregnancies are twins. Naturally conceiving triplets or more (higher-order multiples) is rare. Multiple pregnancies are riskier than single pregnancies. A woman with a multiple pregnancy is more likely to have certain problems during her pregnancy. Therefore, she will need to have more frequent appointments for prenatal care. How does a multiple pregnancy happen? A multiple pregnancy happens when:  The woman's body releases more than one egg at a time, and then each egg gets fertilized by a different sperm. ? This is the most common type of multiple pregnancy. ? Twins or other multiples produced this way are fraternal. They are no more alike than non-multiple siblings are.  One sperm fertilizes one egg, which then divides into more than one embryo. ? Twins or other multiples produced this way are identical. Identical multiples are always the same gender, and they look very much alike.  Who is most likely to have a multiple pregnancy? A multiple pregnancy is more likely to develop in women who:  Have had fertility treatment, especially if the treatment included fertility drugs.  Are older than 26 years of age.  Have already had four or more children.  Have a family history of multiple pregnancy.  How is a multiple pregnancy diagnosed? A multiple pregnancy may be diagnosed based on:  Symptoms such as: ? Rapid weight gain in the first 3 months of pregnancy (first trimester). ? More severe nausea and breast tenderness than what is typical of a single pregnancy. ? The uterus measuring larger than what is normal for the stage of the pregnancy.  Blood tests that detect a higher-than-normal level of human chorionic gonadotropin (hCG). This is a hormone that your body produces in early pregnancy.  Ultrasound  exam. This is used to confirm that you are carrying multiples.  What risks are associated with multiple pregnancy? A multiple pregnancy puts you at a higher risk for certain problems during or after your pregnancy, including:  Having your babies delivered before you have reached a full-term pregnancy (preterm birth). A full-term pregnancy lasts for at least 37 weeks. Babies born before 37 weeks may have a higher risk of a variety of health problems, such as breathing problems, feeding difficulties, cerebral palsy, and learning disabilities.  Diabetes.  Preeclampsia. This is a serious condition that causes high blood pressure along with other symptoms, such as swelling and headaches, during pregnancy.  Excessive blood loss after childbirth (postpartum hemorrhage).  Postpartum depression.  Low birth weight of the babies.  How will having a multiple pregnancy affect my care? Your health care provider will want to monitor you more closely during your pregnancy to make sure that your babies are growing normally and that you are healthy. Follow these instructions at home: Because your pregnancy is considered to be high risk, you will need to work closely with your health care team. You may also need to make some lifestyle changes. These may include the following: Eating and drinking  Increase your nutrition. ? Follow your health care provider's recommendations for weight gain. You may need to gain a little extra weight when you are pregnant with multiples. ? Eat healthy snacks often throughout the day. This can add calories and reduce nausea.  Drink enough fluid to keep your urine clear or pale yellow.  Take prenatal vitamins. Activity By 20-24 weeks, you may   need to limit your activities.  Avoid activities and work that take a lot of effort (are strenuous).  Ask your health care provider when you should stop having sexual intercourse.  Rest often.  General instructions  Do not use  any products that contain nicotine or tobacco, such as cigarettes and e-cigarettes. If you need help quitting, ask your health care provider.  Do not drink alcohol or use illegal drugs.  Take over-the-counter and prescription medicines only as told by your health care provider.  Arrange for extra help around the house.  Keep all follow-up visits and all prenatal visits as told by your health care provider. This is important. Contact a health care provider if:  You have dizziness.  You have persistent nausea, vomiting, or diarrhea.  You are having trouble gaining weight.  You have feelings of depression or other emotions that are interfering with your normal activities. Get help right away if:  You have a fever.  You have pain with urination.  You have fluid leaking from your vagina.  You have a bad-smelling vaginal discharge.  You notice increased swelling in your face, hands, legs, or ankles.  You have spotting or bleeding from your vagina.  You have pelvic cramps, pelvic pressure, or nagging pain in your abdomen or lower back.  You are having regular contractions.  You develop a severe headache, with or without visual changes.  You have shortness of breath or chest pain.  You notice less fetal movement, or no fetal movement. This information is not intended to replace advice given to you by your health care provider. Make sure you discuss any questions you have with your health care provider. Document Released: 11/15/2007 Document Revised: 10/07/2015 Document Reviewed: 10/07/2015 Elsevier Interactive Patient Education  2018 [employer].    [employer] Prenatal Care Providers   [facility] [employer] [facility] at [facility]       Phone: [phone]  Center for [facility] [place] Phone: [phone]  Center for [employer] [facility] [place]  Phone: [phone]  [facility] for [facility] at [facility]  Phone:  [phone]  [facility] for [facility] at [place]  Phone: [phone]  [facility]       Phone: [phone]  [facility]    Phone: [phone]   [facility])    Phone: [phone]  [facility]    Phone: [phone]  [facility]    Phone: [phone]   [facility]  Phone: [phone]  [facility]    Phone: [phone]  [facility]   Phone: [phone]  [facility]    Phone: [phone]

## 2017-06-22 LAB — RH IG WORKUP (INCLUDES ABO/RH)
ABO/RH(D): B NEG
Antibody Screen: NEGATIVE
Gestational Age(Wks): 7
UNIT DIVISION: 0

## 2017-06-25 ENCOUNTER — Encounter: Payer: Self-pay | Admitting: Family Medicine

## 2017-07-03 ENCOUNTER — Ambulatory Visit (INDEPENDENT_AMBULATORY_CARE_PROVIDER_SITE_OTHER): Payer: Medicaid Other | Admitting: Medical

## 2017-07-03 ENCOUNTER — Encounter: Payer: Self-pay | Admitting: Family Medicine

## 2017-07-03 ENCOUNTER — Encounter: Payer: Self-pay | Admitting: Medical

## 2017-07-03 ENCOUNTER — Telehealth: Payer: Self-pay | Admitting: Medical

## 2017-07-03 VITALS — BP 125/80 | HR 87 | Temp 98.7°F | Resp 16 | Ht 65.0 in | Wt 242.4 lb

## 2017-07-03 DIAGNOSIS — R5383 Other fatigue: Secondary | ICD-10-CM

## 2017-07-03 DIAGNOSIS — M791 Myalgia, unspecified site: Secondary | ICD-10-CM

## 2017-07-03 DIAGNOSIS — R509 Fever, unspecified: Secondary | ICD-10-CM

## 2017-07-03 DIAGNOSIS — R195 Other fecal abnormalities: Secondary | ICD-10-CM

## 2017-07-03 DIAGNOSIS — R112 Nausea with vomiting, unspecified: Secondary | ICD-10-CM

## 2017-07-03 LAB — CBC WITH DIFFERENTIAL/PLATELET
BASOS ABS: 0 10*3/uL (ref 0.0–0.1)
BASOS PCT: 0.3 % (ref 0.0–3.0)
EOS ABS: 0.1 10*3/uL (ref 0.0–0.7)
Eosinophils Relative: 0.7 % (ref 0.0–5.0)
HEMATOCRIT: 36 % (ref 36.0–46.0)
HEMOGLOBIN: 11.9 g/dL — AB (ref 12.0–15.0)
LYMPHS PCT: 26.5 % (ref 12.0–46.0)
Lymphs Abs: 2.1 10*3/uL (ref 0.7–4.0)
MCHC: 33.2 g/dL (ref 30.0–36.0)
MCV: 82.8 fl (ref 78.0–100.0)
MONOS PCT: 6.9 % (ref 3.0–12.0)
Monocytes Absolute: 0.5 10*3/uL (ref 0.1–1.0)
Neutro Abs: 5.2 10*3/uL (ref 1.4–7.7)
Neutrophils Relative %: 65.6 % (ref 43.0–77.0)
PLATELETS: 259 10*3/uL (ref 150.0–400.0)
RBC: 4.34 Mil/uL (ref 3.87–5.11)
RDW: 17.9 % — AB (ref 11.5–15.5)
WBC: 7.9 10*3/uL (ref 4.0–10.5)

## 2017-07-03 LAB — COMPREHENSIVE METABOLIC PANEL
ALBUMIN: 4.1 g/dL (ref 3.5–5.2)
ALT: 12 U/L (ref 0–35)
AST: 11 U/L (ref 0–37)
Alkaline Phosphatase: 47 U/L (ref 39–117)
BUN: 5 mg/dL — ABNORMAL LOW (ref 6–23)
CALCIUM: 10.4 mg/dL (ref 8.4–10.5)
CO2: 25 meq/L (ref 19–32)
Chloride: 103 mEq/L (ref 96–112)
Creatinine, Ser: 0.5 mg/dL (ref 0.40–1.20)
GFR: 159.08 mL/min (ref >60.00)
Glucose, Bld: 91 mg/dL (ref 70–99)
Potassium: 4.8 mEq/L (ref 3.5–5.1)
Sodium: 135 mEq/L (ref 135–145)
Total Bilirubin: 0.4 mg/dL (ref 0.2–1.2)
Total Protein: 7.1 g/dL (ref 6.0–8.3)

## 2017-07-03 LAB — POC URINALSYSI DIPSTICK (AUTOMATED)
Bilirubin, UA: NEGATIVE
Blood, UA: NEGATIVE
Glucose, UA: NEGATIVE
Ketones, UA: POSITIVE
NITRITE UA: NEGATIVE
PH UA: 7 (ref 5.0–8.0)
PROTEIN UA: POSITIVE
SPEC GRAV UA: 1.015 (ref 1.010–1.025)
UROBILINOGEN UA: 0.2 U/dL

## 2017-07-03 LAB — POCT INFLUENZA A/B
INFLUENZA A, POC: NEGATIVE
INFLUENZA B, POC: NEGATIVE

## 2017-07-03 LAB — POCT RAPID STREP A (OFFICE): Rapid Strep A Screen: NEGATIVE

## 2017-07-03 MED ORDER — ONDANSETRON 4 MG PO TBDP: 4.0000 mg | ORAL_TABLET | Freq: Three times a day (TID) | ORAL | 0 refills | Status: DC | PRN

## 2017-07-03 NOTE — Patient Instructions (Addendum)
For your moderate nausea with vomiting, I am prescribing Zofran.  I did talk with our pharmacist downstairs and she confirmed that Zofran is category B medication.  Your previous morning sickness with prior pregnancies was much less and will give short course of Zofran to help.  You reported fever this morning but on exam and testing today no obvious cause of for fever found. Rapid strep test was negative and rapid flu test negative.  Send out throat culture being done.(in light of your work location best to follow results for caution sake)  Your urine does not show any definite indicators of infection but will still send out a urine culture.  Since you do report some fatigue and fever will go ahead and get CBC and metabolic panel.  I am writing you off work for 4 days.  You can return on Monday provided that you feel that up to it.  If you have a worsening signs or symptoms please let us know.  If any vaginal bleeding recommend ED evaluation.  I did go ahead and write a referral for you to see Dr. Shawnie Pons.  I am asking they see you soon if possible.  You do have ketones present in your urine.  Recommend hydrate with [employer] water.  Over the next couple of days try to drink 3 bottles a day.  But also to continue to eat and drink normally.  Follow up date to be determined. Depending on if we can get your OB appointment moved up to next week.
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
## 2017-07-03 NOTE — Telephone Encounter (Signed)
Jasmine would you mind calling patient tomorrow and seeing how she is doing.  She mentions some loose stools yesterday.  I did a work-up for her fever.  Later after thinking about her diarrhea decided to put in future stool panel studies.  If her diarrhea is still persisting by tomorrow would you coordinate with the lab and get her scheduled to pick up stool kit.   Gwenn, I put in referral for patient to see Dr. Ranee Gosselin.  Please see that referral.  I am trying to get her appointment moved up.  She is pregnant with twins and had prior pregnancy that was premature delivery.  This puts her at higher risk.  Please see that referral.  I think patient stated initially they might be able to see her this coming Monday.  But then she ended up taking a later appointment due to work conflict.  Now she states that she will go sooner.

## 2017-07-04 NOTE — Telephone Encounter (Signed)
Notes and labs faxed to OB-GYN.

## 2017-07-04 NOTE — Telephone Encounter (Signed)
Copied from CRM [id number]. Topic: Quick Communication - See Telephone Encounter >> Jul 04, 2017  9:42 AM Terisa Starr wrote: CRM for notification. See Telephone encounter for: 07/04/17.  Patient states she was in the office yesterday and saw Ramon Dredge. She said that [facility] sent her to her Obgyn. The obgyn ( Dr Carlyon Prows in [place] ) is requesting her office notes from yesterday and lab work results. They are able to see her today @ 3:00 pm. There fax number is [phone].

## 2017-07-05 LAB — CULTURE, URINE COMPREHENSIVE
MICRO NUMBER:: 90592181
SPECIMEN QUALITY:: ADEQUATE

## 2017-07-05 LAB — CULTURE, GROUP A STREP
MICRO NUMBER:: 90592047
SPECIMEN QUALITY:: ADEQUATE

## 2017-07-08 ENCOUNTER — Encounter: Payer: Self-pay | Admitting: Medical

## 2017-07-22 ENCOUNTER — Ambulatory Visit (INDEPENDENT_AMBULATORY_CARE_PROVIDER_SITE_OTHER): Payer: Medicaid Other | Admitting: Family Medicine

## 2017-07-22 ENCOUNTER — Encounter: Payer: Self-pay | Admitting: Family Medicine

## 2017-07-22 VITALS — BP 130/78 | HR 102 | Resp 16 | Ht 65.0 in | Wt 239.0 lb

## 2017-07-22 DIAGNOSIS — R059 Cough, unspecified: Secondary | ICD-10-CM

## 2017-07-22 DIAGNOSIS — F329 Major depressive disorder, single episode, unspecified: Secondary | ICD-10-CM

## 2017-07-22 DIAGNOSIS — R05 Cough: Secondary | ICD-10-CM

## 2017-07-22 DIAGNOSIS — F419 Anxiety disorder, unspecified: Secondary | ICD-10-CM | POA: Diagnosis not present

## 2017-07-22 DIAGNOSIS — O30041 Twin pregnancy, dichorionic/diamniotic, first trimester: Secondary | ICD-10-CM

## 2017-07-22 DIAGNOSIS — F32A Depression, unspecified: Secondary | ICD-10-CM

## 2017-07-22 NOTE — Patient Instructions (Signed)
It was good to see you today- best of luck with everything.  Let me know if I can help you I gave you an OTC med list that you can reference as needed for meds that are safe for you to use
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
## 2017-09-16 ENCOUNTER — Other Ambulatory Visit: Payer: Self-pay | Admitting: Family Medicine

## 2017-12-14 ENCOUNTER — Inpatient Hospital Stay (HOSPITAL_COMMUNITY)
Admission: AD | Admit: 2017-12-14 | Discharge: 2017-12-14 | Disposition: A | Discharge status: Home / Self Care | Payer: Medicaid Other | Source: Ambulatory Visit | Attending: Obstetrics & Gynecology | Admitting: Obstetrics & Gynecology

## 2017-12-14 ENCOUNTER — Inpatient Hospital Stay (HOSPITAL_BASED_OUTPATIENT_CLINIC_OR_DEPARTMENT_OTHER): Payer: Medicaid Other

## 2017-12-14 ENCOUNTER — Other Ambulatory Visit: Payer: Self-pay

## 2017-12-14 ENCOUNTER — Encounter (HOSPITAL_COMMUNITY): Payer: Self-pay

## 2017-12-14 DIAGNOSIS — O99283 Endocrine, nutritional and metabolic diseases complicating pregnancy, third trimester: Secondary | ICD-10-CM | POA: Insufficient documentation

## 2017-12-14 DIAGNOSIS — Z3A32 32 weeks gestation of pregnancy: Secondary | ICD-10-CM | POA: Diagnosis not present

## 2017-12-14 DIAGNOSIS — R001 Bradycardia, unspecified: Secondary | ICD-10-CM | POA: Insufficient documentation

## 2017-12-14 DIAGNOSIS — R55 Syncope and collapse: Secondary | ICD-10-CM | POA: Insufficient documentation

## 2017-12-14 DIAGNOSIS — O99213 Obesity complicating pregnancy, third trimester: Secondary | ICD-10-CM

## 2017-12-14 DIAGNOSIS — F419 Anxiety disorder, unspecified: Secondary | ICD-10-CM | POA: Insufficient documentation

## 2017-12-14 DIAGNOSIS — F329 Major depressive disorder, single episode, unspecified: Secondary | ICD-10-CM | POA: Diagnosis not present

## 2017-12-14 DIAGNOSIS — Z3689 Encounter for other specified antenatal screening: Secondary | ICD-10-CM

## 2017-12-14 DIAGNOSIS — O99013 Anemia complicating pregnancy, third trimester: Secondary | ICD-10-CM | POA: Insufficient documentation

## 2017-12-14 DIAGNOSIS — Z7982 Long term (current) use of aspirin: Secondary | ICD-10-CM | POA: Insufficient documentation

## 2017-12-14 DIAGNOSIS — O30043 Twin pregnancy, dichorionic/diamniotic, third trimester: Secondary | ICD-10-CM | POA: Insufficient documentation

## 2017-12-14 DIAGNOSIS — O30003 Twin pregnancy, unspecified number of placenta and unspecified number of amniotic sacs, third trimester: Secondary | ICD-10-CM

## 2017-12-14 DIAGNOSIS — E282 Polycystic ovarian syndrome: Secondary | ICD-10-CM | POA: Insufficient documentation

## 2017-12-14 DIAGNOSIS — R030 Elevated blood-pressure reading, without diagnosis of hypertension: Secondary | ICD-10-CM | POA: Insufficient documentation

## 2017-12-14 DIAGNOSIS — R808 Other proteinuria: Secondary | ICD-10-CM

## 2017-12-14 DIAGNOSIS — O26893 Other specified pregnancy related conditions, third trimester: Secondary | ICD-10-CM | POA: Insufficient documentation

## 2017-12-14 DIAGNOSIS — Z79899 Other long term (current) drug therapy: Secondary | ICD-10-CM | POA: Insufficient documentation

## 2017-12-14 DIAGNOSIS — O4703 False labor before 37 completed weeks of gestation, third trimester: Secondary | ICD-10-CM

## 2017-12-14 DIAGNOSIS — O1213 Gestational proteinuria, third trimester: Secondary | ICD-10-CM | POA: Diagnosis not present

## 2017-12-14 DIAGNOSIS — O99343 Other mental disorders complicating pregnancy, third trimester: Secondary | ICD-10-CM | POA: Insufficient documentation

## 2017-12-14 DIAGNOSIS — D508 Other iron deficiency anemias: Secondary | ICD-10-CM

## 2017-12-14 DIAGNOSIS — Z915 Personal history of self-harm: Secondary | ICD-10-CM | POA: Insufficient documentation

## 2017-12-14 DIAGNOSIS — D649 Anemia, unspecified: Secondary | ICD-10-CM | POA: Diagnosis not present

## 2017-12-14 LAB — URINALYSIS, ROUTINE W REFLEX MICROSCOPIC
Bilirubin Urine: NEGATIVE
Glucose, UA: NEGATIVE mg/dL
Hgb urine dipstick: NEGATIVE
Ketones, ur: NEGATIVE mg/dL
Nitrite: NEGATIVE
PH: 7 (ref 5.0–8.0)
Protein, ur: 100 mg/dL — AB
SPECIFIC GRAVITY, URINE: 1.013 (ref 1.005–1.030)
Trans Epithel, UA: <1

## 2017-12-14 LAB — PROTEIN / CREATININE RATIO, URINE
CREATININE, URINE: 182 mg/dL
Protein Creatinine Ratio: 0.81 mg/mg{Cre} — ABNORMAL HIGH (ref 0.00–0.15)
Total Protein, Urine: 148 mg/dL

## 2017-12-14 LAB — CBC
HEMATOCRIT: 25.9 % — AB (ref 36.0–46.0)
HEMOGLOBIN: 8.9 g/dL — AB (ref 12.0–15.0)
MCH: 28.7 pg (ref 26.0–34.0)
MCHC: 34.4 g/dL (ref 30.0–36.0)
MCV: 83.5 fL (ref 80.0–100.0)
NRBC: 0 % (ref 0.0–0.2)
Platelets: 267 10*3/uL (ref 150–400)
RBC: 3.1 MIL/uL — AB (ref 3.87–5.11)
RDW: 14.7 % (ref 11.5–15.5)
WBC: 10.3 10*3/uL (ref 4.0–10.5)

## 2017-12-14 LAB — COMPREHENSIVE METABOLIC PANEL
ALT: 8 U/L (ref 0–44)
AST: 11 U/L — AB (ref 15–41)
Albumin: 3 g/dL — ABNORMAL LOW (ref 3.5–5.0)
Alkaline Phosphatase: 92 U/L (ref 38–126)
Anion gap: 9 (ref 5–15)
BILIRUBIN TOTAL: 0.5 mg/dL (ref 0.3–1.2)
CHLORIDE: 103 mmol/L (ref 98–111)
CO2: 22 mmol/L (ref 22–32)
CREATININE: 0.67 mg/dL (ref 0.44–1.00)
Calcium: 10 mg/dL (ref 8.9–10.3)
GFR calc Af Amer: >60 mL/min (ref >60)
Glucose, Bld: 98 mg/dL (ref 70–99)
Potassium: 3.8 mmol/L (ref 3.5–5.1)
Sodium: 134 mmol/L — ABNORMAL LOW (ref 135–145)
TOTAL PROTEIN: 6.4 g/dL — AB (ref 6.5–8.1)
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
## 2017-12-14 NOTE — Discharge Instructions (Signed)
See a doctor this week for prenatal care. Return if the cramping worsens or if you have vaginal bleeding or rupture of membranes Take your prenatal vitamin daily Take an over the counter iron supplement and a stool softener - colace (one by mouth daily).

## 2017-12-26 ENCOUNTER — Encounter: Payer: Medicaid Other | Admitting: Family Medicine

## 2018-01-03 HISTORY — PX: TUBAL LIGATION: SHX77
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
## 2018-02-27 ENCOUNTER — Encounter: Payer: Self-pay | Admitting: Family Medicine

## 2018-02-27 ENCOUNTER — Emergency Department (HOSPITAL_BASED_OUTPATIENT_CLINIC_OR_DEPARTMENT_OTHER)
Admission: EM | Admit: 2018-02-27 | Discharge: 2018-02-27 | Disposition: A | Discharge status: Home / Self Care | Payer: Medicaid Other | Attending: Emergency Medicine | Admitting: Emergency Medicine

## 2018-02-27 ENCOUNTER — Other Ambulatory Visit: Payer: Self-pay

## 2018-02-27 ENCOUNTER — Emergency Department (HOSPITAL_BASED_OUTPATIENT_CLINIC_OR_DEPARTMENT_OTHER): Payer: Medicaid Other

## 2018-02-27 ENCOUNTER — Encounter (HOSPITAL_BASED_OUTPATIENT_CLINIC_OR_DEPARTMENT_OTHER): Payer: Self-pay | Admitting: Emergency Medicine

## 2018-02-27 ENCOUNTER — Ambulatory Visit (INDEPENDENT_AMBULATORY_CARE_PROVIDER_SITE_OTHER): Payer: Medicaid Other | Admitting: Family Medicine

## 2018-02-27 VITALS — BP 128/90 | HR 96 | Temp 98.6°F | Resp 16 | Ht 65.0 in | Wt 248.0 lb

## 2018-02-27 DIAGNOSIS — Z7982 Long term (current) use of aspirin: Secondary | ICD-10-CM | POA: Diagnosis not present

## 2018-02-27 DIAGNOSIS — Z79899 Other long term (current) drug therapy: Secondary | ICD-10-CM | POA: Insufficient documentation

## 2018-02-27 DIAGNOSIS — Z0000 Encounter for general adult medical examination without abnormal findings: Secondary | ICD-10-CM

## 2018-02-27 DIAGNOSIS — R1011 Right upper quadrant pain: Secondary | ICD-10-CM | POA: Insufficient documentation

## 2018-02-27 DIAGNOSIS — F4323 Adjustment disorder with mixed anxiety and depressed mood: Secondary | ICD-10-CM | POA: Diagnosis not present

## 2018-02-27 LAB — URINALYSIS, MICROSCOPIC (REFLEX)

## 2018-02-27 LAB — COMPREHENSIVE METABOLIC PANEL
ALT: 21 U/L (ref 0–44)
AST: 20 U/L (ref 15–41)
Albumin: 4.1 g/dL (ref 3.5–5.0)
Alkaline Phosphatase: 70 U/L (ref 38–126)
Anion gap: 7 (ref 5–15)
BUN: 15 mg/dL (ref 6–20)
CHLORIDE: 105 mmol/L (ref 98–111)
CO2: 25 mmol/L (ref 22–32)
Calcium: 10.3 mg/dL (ref 8.9–10.3)
Creatinine, Ser: 0.57 mg/dL (ref 0.44–1.00)
GFR calc Af Amer: >60 mL/min (ref >60)
GFR calc non Af Amer: >60 mL/min (ref >60)
GLUCOSE: 118 mg/dL — AB (ref 70–99)
Potassium: 4.1 mmol/L (ref 3.5–5.1)
Sodium: 137 mmol/L (ref 135–145)
Total Bilirubin: 0.3 mg/dL (ref 0.3–1.2)
Total Protein: 7.7 g/dL (ref 6.5–8.1)

## 2018-02-27 LAB — CBC WITH DIFFERENTIAL/PLATELET
Abs Immature Granulocytes: 0.03 10*3/uL (ref 0.00–0.07)
Basophils Absolute: 0 10*3/uL (ref 0.0–0.1)
Basophils Relative: 0 %
Eosinophils Absolute: 0.2 10*3/uL (ref 0.0–0.5)
Eosinophils Relative: 1 %
HEMATOCRIT: 36.3 % (ref 36.0–46.0)
Hemoglobin: 11.2 g/dL — ABNORMAL LOW (ref 12.0–15.0)
Immature Granulocytes: 0 %
Lymphocytes Relative: 18 %
Lymphs Abs: 2.2 10*3/uL (ref 0.7–4.0)
MCH: 25 pg — ABNORMAL LOW (ref 26.0–34.0)
MCHC: 30.9 g/dL (ref 30.0–36.0)
MCV: 81 fL (ref 80.0–100.0)
Monocytes Absolute: 0.8 10*3/uL (ref 0.1–1.0)
Monocytes Relative: 7 %
NEUTROS ABS: 8.9 10*3/uL — AB (ref 1.7–7.7)
Neutrophils Relative %: 74 %
Platelets: 326 10*3/uL (ref 150–400)
RBC: 4.48 MIL/uL (ref 3.87–5.11)
RDW: 14.9 % (ref 11.5–15.5)
WBC: 12.2 10*3/uL — ABNORMAL HIGH (ref 4.0–10.5)
nRBC: 0 % (ref 0.0–0.2)

## 2018-02-27 LAB — URINALYSIS, ROUTINE W REFLEX MICROSCOPIC
Bilirubin Urine: NEGATIVE
Glucose, UA: NEGATIVE mg/dL
Hgb urine dipstick: NEGATIVE
Ketones, ur: NEGATIVE mg/dL
Leukocytes, UA: NEGATIVE
Nitrite: NEGATIVE
Protein, ur: 100 mg/dL — AB
Specific Gravity, Urine: >1.03 — ABNORMAL HIGH (ref 1.005–1.030)
pH: 5.5 (ref 5.0–8.0)

## 2018-02-27 LAB — LIPASE, BLOOD: Lipase: 38 U/L (ref 11–51)

## 2018-02-27 LAB — PREGNANCY, URINE: PREG TEST UR: NEGATIVE

## 2018-02-27 MED ORDER — SERTRALINE HCL 50 MG PO TABS: 50.0000 mg | ORAL_TABLET | Freq: Every day | ORAL | 5 refills | Status: DC

## 2018-02-27 MED ORDER — ONDANSETRON 4 MG PO TBDP: 4.0000 mg | ORAL_TABLET | Freq: Three times a day (TID) | ORAL | 0 refills | Status: DC | PRN

## 2018-02-27 MED ORDER — ONDANSETRON HCL 4 MG/2ML IJ SOLN
4.0000 mg | Freq: Once | INTRAMUSCULAR | Status: AC
  Administered 2018-02-27: 4 mg via INTRAVENOUS
  Filled 2018-02-27: qty 2

## 2018-02-27 MED ORDER — HYDROCODONE-ACETAMINOPHEN 5-325 MG PO TABS: 1.0000 | ORAL_TABLET | ORAL | 0 refills | Status: DC | PRN

## 2018-02-27 MED ORDER — FENTANYL CITRATE (PF) 100 MCG/2ML IJ SOLN
100.0000 ug | Freq: Once | INTRAMUSCULAR | Status: AC
  Administered 2018-02-27: 100 ug via INTRAVENOUS
  Filled 2018-02-27: qty 2

## 2018-02-27 MED ORDER — ALPRAZOLAM 0.5 MG PO TABS: 0.2500 mg | ORAL_TABLET | Freq: Two times a day (BID) | ORAL | 1 refills | Status: DC | PRN
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
## 2018-02-27 NOTE — Discharge Instructions (Signed)
Avoid fatty and greasy foods.  If pain comes back, you may need a HIDA scan.

## 2018-02-27 NOTE — Patient Instructions (Signed)
It was really nice to see you again today, many congratulations on your twin daughters It does like you have a lot going on right now.  We are here to support you in any way that we can But start you back on Zoloft 50 mg.  Take 1 daily for 2 weeks, then you may increase to 2 pills or 100 mg if desired. I also gave you a prescription for Xanax to use as needed for panic attack.  Remember this medication can be sedating, do not easily need to drive or combine with any other sedating medication  It sounds like your gallbladder looked okay on ultrasound today, let me know if you continue to have pain  Please see me in 2 to 3 months to check on how you are doing  Health Maintenance, Female Adopting a healthy lifestyle and getting preventive care can go a long way to promote health and wellness. Talk with your health care provider about what schedule of regular examinations is right for you. This is a good chance for you to check in with your provider about disease prevention and staying healthy. In between checkups, there are plenty of things you can do on your own. Experts have done a lot of research about which lifestyle changes and preventive measures are most likely to keep you healthy. Ask your health care provider for more information. Weight and diet Eat a healthy diet  Be sure to include plenty of vegetables, fruits, low-fat dairy products, and lean protein.  Do not eat a lot of foods high in solid fats, added sugars, or salt.  Get regular exercise. This is one of the most important things you can do for your health. ? Most adults should exercise for at least 150 minutes each week. The exercise should increase your heart rate and make you sweat (moderate-intensity exercise). ? Most adults should also do strengthening exercises at least twice a week. This is in addition to the moderate-intensity exercise. Maintain a healthy weight  Body mass index (BMI) is a measurement that can be used to  identify possible weight problems. It estimates body fat based on height and weight. Your health care provider can help determine your BMI and help you achieve or maintain a healthy weight.  For females 72 years of age and older: ? A BMI below 18.5 is considered underweight. ? A BMI of 18.5 to 24.9 is normal. ? A BMI of 25 to 29.9 is considered overweight. ? A BMI of 30 and above is considered obese. Watch levels of cholesterol and blood lipids  You should start having your blood tested for lipids and cholesterol at 27 years of age, then have this test every 5 years.  You may need to have your cholesterol levels checked more often if: ? Your lipid or cholesterol levels are high. ? You are older than 27 years of age. ? You are at high risk for heart disease. Cancer screening Lung Cancer  Lung cancer screening is recommended for adults 76-45 years old who are at high risk for lung cancer because of a history of smoking.  A yearly low-dose CT scan of the lungs is recommended for people who: ? Currently smoke. ? Have quit within the past 15 years. ? Have at least a 30-pack-year history of smoking. A pack year is smoking an average of one pack of cigarettes a day for 1 year.  Yearly screening should continue until it has been 15 years since you quit.  Yearly  screening should stop if you develop a health problem that would prevent you from having lung cancer treatment. Breast Cancer  Practice breast self-awareness. This means understanding how your breasts normally appear and feel.  It also means doing regular breast self-exams. Let your health care provider know about any changes, no matter how small.  If you are in your 20s or 30s, you should have a clinical breast exam (CBE) by a health care provider every 1-3 years as part of a regular health exam.  If you are 38 or older, have a CBE every year. Also consider having a breast X-ray (mammogram) every year.  If you have a family  history of breast cancer, talk to your health care provider about genetic screening.  If you are at high risk for breast cancer, talk to your health care provider about having an MRI and a mammogram every year.  Breast cancer gene (BRCA) assessment is recommended for women who have family members with BRCA-related cancers. BRCA-related cancers include: ? Breast. ? Ovarian. ? Tubal. ? Peritoneal cancers.  Results of the assessment will determine the need for genetic counseling and BRCA1 and BRCA2 testing. Cervical Cancer Your health care provider may recommend that you be screened regularly for cancer of the pelvic organs (ovaries, uterus, and vagina). This screening involves a pelvic examination, including checking for microscopic changes to the surface of your cervix (Pap test). You may be encouraged to have this screening done every 3 years, beginning at age 68.  For women ages 74-65, health care providers may recommend pelvic exams and Pap testing every 3 years, or they may recommend the Pap and pelvic exam, combined with testing for human papilloma virus (HPV), every 5 years. Some types of HPV increase your risk of cervical cancer. Testing for HPV may also be done on women of any age with unclear Pap test results.  Other health care providers may not recommend any screening for nonpregnant women who are considered low risk for pelvic cancer and who do not have symptoms. Ask your health care provider if a screening pelvic exam is right for you.  If you have had past treatment for cervical cancer or a condition that could lead to cancer, you need Pap tests and screening for cancer for at least 20 years after your treatment. If Pap tests have been discontinued, your risk factors (such as having a new sexual partner) need to be reassessed to determine if screening should resume. Some women have medical problems that increase the chance of getting cervical cancer. In these cases, your health care  provider may recommend more frequent screening and Pap tests. Colorectal Cancer  This type of cancer can be detected and often prevented.  Routine colorectal cancer screening usually begins at 27 years of age and continues through 27 years of age.  Your health care provider may recommend screening at an earlier age if you have risk factors for colon cancer.  Your health care provider may also recommend using home test kits to check for hidden blood in the stool.  A small camera at the end of a tube can be used to examine your colon directly (sigmoidoscopy or colonoscopy). This is done to check for the earliest forms of colorectal cancer.  Routine screening usually begins at age 50.  Direct examination of the colon should be repeated every 5-10 years through 27 years of age. However, you may need to be screened more often if early forms of precancerous polyps or small growths  are found. Skin Cancer  Check your skin from head to toe regularly.  Tell your health care provider about any new moles or changes in moles, especially if there is a change in a mole's shape or color.  Also tell your health care provider if you have a mole that is larger than the size of a pencil eraser.  Always use sunscreen. Apply sunscreen liberally and repeatedly throughout the day.  Protect yourself by wearing long sleeves, pants, a wide-brimmed hat, and sunglasses whenever you are outside. Heart disease, diabetes, and high blood pressure  High blood pressure causes heart disease and increases the risk of stroke. High blood pressure is more likely to develop in: ? People who have blood pressure in the high end of the normal range (130-139/85-89 mm Hg). ? People who are overweight or obese. ? People who are African American.  If you are 74-16 years of age, have your blood pressure checked every 3-5 years. If you are 12 years of age or older, have your blood pressure checked every year. You should have your  blood pressure measured twice-once when you are at a hospital or clinic, and once when you are not at a hospital or clinic. Record the average of the two measurements. To check your blood pressure when you are not at a hospital or clinic, you can use: ? An automated blood pressure machine at a pharmacy. ? A home blood pressure monitor.  If you are between 31 years and 103 years old, ask your health care provider if you should take aspirin to prevent strokes.  Have regular diabetes screenings. This involves taking a blood sample to check your fasting blood sugar level. ? If you are at a normal weight and have a low risk for diabetes, have this test once every three years after 27 years of age. ? If you are overweight and have a high risk for diabetes, consider being tested at a younger age or more often. Preventing infection Hepatitis B  If you have a higher risk for hepatitis B, you should be screened for this virus. You are considered at high risk for hepatitis B if: ? You were born in a country where hepatitis B is common. Ask your health care provider which countries are considered high risk. ? Your parents were born in a high-risk country, and you have not been immunized against hepatitis B (hepatitis B vaccine). ? You have HIV or AIDS. ? You use needles to inject street drugs. ? You live with someone who has hepatitis B. ? You have had sex with someone who has hepatitis B. ? You get hemodialysis treatment. ? You take certain medicines for conditions, including cancer, organ transplantation, and autoimmune conditions. Hepatitis C  Blood testing is recommended for: ? Everyone born from [birth year] through 1964. ? Anyone with known risk factors for hepatitis C. Sexually transmitted infections (STIs)  You should be screened for sexually transmitted infections (STIs) including gonorrhea and chlamydia if: ? You are sexually active and are younger than 27 years of age. ? You are older than 27  years of age and your health care provider tells you that you are at risk for this type of infection. ? Your sexual activity has changed since you were last screened and you are at an increased risk for chlamydia or gonorrhea. Ask your health care provider if you are at risk.  If you do not have HIV, but are at risk, it may be recommended that you take  a prescription medicine daily to prevent HIV infection. This is called pre-exposure prophylaxis (PrEP). You are considered at risk if: ? You are sexually active and do not regularly use condoms or know the HIV status of your partner(s). ? You take drugs by injection. ? You are sexually active with a partner who has HIV. Talk with your health care provider about whether you are at high risk of being infected with HIV. If you choose to begin PrEP, you should first be tested for HIV. You should then be tested every 3 months for as long as you are taking PrEP. Pregnancy  If you are premenopausal and you may become pregnant, ask your health care provider about preconception counseling.  If you may become pregnant, take 400 to 800 micrograms (mcg) of folic acid every day.  If you want to prevent pregnancy, talk to your health care provider about birth control (contraception). Osteoporosis and menopause  Osteoporosis is a disease in which the bones lose minerals and strength with aging. This can result in serious bone fractures. Your risk for osteoporosis can be identified using a bone density scan.  If you are 79 years of age or older, or if you are at risk for osteoporosis and fractures, ask your health care provider if you should be screened.  Ask your health care provider whether you should take a calcium or vitamin D supplement to lower your risk for osteoporosis.  Menopause may have certain physical symptoms and risks.  Hormone replacement therapy may reduce some of these symptoms and risks. Talk to your health care provider about whether  hormone replacement therapy is right for you. Follow these instructions at home:  Schedule regular health, dental, and eye exams.  Stay current with your immunizations.  Do not use any tobacco products including cigarettes, chewing tobacco, or electronic cigarettes.  If you are pregnant, do not drink alcohol.  If you are breastfeeding, limit how much and how often you drink alcohol.  Limit alcohol intake to no more than 1 drink per day for nonpregnant women. One drink equals 12 ounces of beer, 5 ounces of wine, or 1 ounces of hard liquor.  Do not use street drugs.  Do not share needles.  Ask your health care provider for help if you need support or information about quitting drugs.  Tell your health care provider if you often feel depressed.  Tell your health care provider if you have ever been abused or do not feel safe at home. This information is not intended to replace advice given to you by your health care provider. Make sure you discuss any questions you have with your health care provider. Document Released: 08/21/2010 Document Revised: 07/14/2015 Document Reviewed: 11/09/2014 Elsevier Interactive Patient Education  2019 [employer].

## 2018-02-27 NOTE — ED Provider Notes (Signed)
Pt signed out by Dr. Read Drivers pending RUQ [place].  This was normal.  Pt's pain is gone.  Labs ok.  Pt encouraged to avoid fatty and greasy foods.  She is told that if pain comes back, she may need a HIDA scan.  She knows to return if worse.  F/u with pcp.   Jacalyn Lefevre, MD 02/27/18 [phone]

## 2018-02-27 NOTE — ED Notes (Signed)
Patient transported to Ultrasound 

## 2018-02-27 NOTE — ED Triage Notes (Signed)
Pt c/o 10/10 right side flank pain radiating to right lower abd since today at 2 am.

## 2018-03-28 ENCOUNTER — Encounter: Payer: Self-pay | Admitting: Family Medicine

## 2018-04-10 ENCOUNTER — Other Ambulatory Visit: Payer: Self-pay | Admitting: Family Medicine

## 2018-05-05 ENCOUNTER — Other Ambulatory Visit: Payer: Self-pay | Admitting: Family Medicine

## 2018-05-05 DIAGNOSIS — F4323 Adjustment disorder with mixed anxiety and depressed mood: Secondary | ICD-10-CM

## 2018-05-15 ENCOUNTER — Ambulatory Visit: Payer: Medicaid Other | Admitting: Family Medicine

## 2018-06-04 ENCOUNTER — Telehealth: Payer: Medicaid Other | Admitting: Nurse Practitioner

## 2018-06-04 DIAGNOSIS — M545 Low back pain, unspecified: Secondary | ICD-10-CM

## 2018-06-04 MED ORDER — NAPROXEN 500 MG PO TABS: 500.0000 mg | ORAL_TABLET | Freq: Two times a day (BID) | ORAL | 1 refills | Status: DC

## 2018-06-04 MED ORDER — CYCLOBENZAPRINE HCL 10 MG PO TABS: 10.0000 mg | ORAL_TABLET | Freq: Three times a day (TID) | ORAL | 1 refills | Status: DC | PRN

## 2018-06-04 NOTE — Progress Notes (Unsigned)

## 2018-06-27 ENCOUNTER — Ambulatory Visit: Payer: Self-pay | Admitting: Family Medicine

## 2018-06-27 NOTE — Telephone Encounter (Signed)
Pt. Reports she had twins 6 months ago and feels like she has some "post-partum depression. I feel very overwhelmed." Reports is "crying a lot and lashing out at everyone." Is taking Zoloft and Xanax, " nothing is helping." Denies any suicidal plan.Warm transfer to Sherrie at the practice for virtual visit.  Answer Assessment - Initial Assessment Questions 1. CONCERN: "What happened that made you call today?"     Overwhelmed 2. DEPRESSION SYMPTOM SCREENING: "How are you feeling overall?" (e.g., decreased energy, increased sleeping or difficulty sleeping, difficulty concentrating, feelings of sadness, guilt, hopelessness, or worthlessness)     Crying, lashing out 3. RISK OF HARM - SUICIDAL IDEATION:  "Do you ever have thoughts of hurting or killing yourself?"  (e.g., yes, no, no but preoccupation with thoughts about death)   - INTENT:  "Do you have thoughts of hurting or killing yourself right NOW?" (e.g., yes, no, N/A)   - PLAN: "Do you have a specific plan for how you would do this?" (e.g., gun, knife, overdose, no plan, N/A)     Has thought about it in the past 4. RISK OF HARM - HOMICIDAL IDEATION:  "Do you ever have thoughts of hurting or killing someone else?"  (e.g., yes, no, no but preoccupation with thoughts about death)   - INTENT:  "Do you have thoughts of hurting or killing someone right NOW?" (e.g., yes, no, N/A)   - PLAN: "Do you have a specific plan for how you would do this?" (e.g., gun, knife, no plan, N/A)      No 5. FUNCTIONAL IMPAIRMENT: "How have things been going for you overall in your life? Have you had any more difficulties than usual doing your normal daily activities?"  (e.g., better, same, worse; self-care, school, work, interactions)     Same 6. SUPPORT: "Who is with you now?" "Who do you live with?" "Do you have family or friends nearby who you can talk to?"      Fiancee 7. THERAPIST: "Do you have a counselor or therapist? Name?"     No one now 8. STRESSORS: "Has  there been any new stress or recent changes in your life?"     Had twins 6 months 9. DRUG ABUSE/ALCOHOL: "Do you drink alcohol or use any illegal drugs?"      No 10. OTHER: "Do you have any other health or medical symptoms right now?" (e.g., fever)       Stomach issues - nausea 11. PREGNANCY: "Is there any chance you are pregnant?" "When was your last menstrual period?"       No  Protocols used: DEPRESSION-A-AH
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
## 2018-06-30 ENCOUNTER — Encounter: Payer: Self-pay | Admitting: Family Medicine

## 2018-06-30 ENCOUNTER — Other Ambulatory Visit: Payer: Self-pay

## 2018-06-30 ENCOUNTER — Ambulatory Visit (INDEPENDENT_AMBULATORY_CARE_PROVIDER_SITE_OTHER): Payer: Medicaid Other | Admitting: Family Medicine

## 2018-06-30 DIAGNOSIS — F329 Major depressive disorder, single episode, unspecified: Secondary | ICD-10-CM

## 2018-06-30 DIAGNOSIS — F419 Anxiety disorder, unspecified: Secondary | ICD-10-CM | POA: Diagnosis not present

## 2018-06-30 DIAGNOSIS — K219 Gastro-esophageal reflux disease without esophagitis: Secondary | ICD-10-CM | POA: Diagnosis not present

## 2018-06-30 DIAGNOSIS — F4323 Adjustment disorder with mixed anxiety and depressed mood: Secondary | ICD-10-CM

## 2018-06-30 DIAGNOSIS — F32A Depression, unspecified: Secondary | ICD-10-CM

## 2018-06-30 MED ORDER — SERTRALINE HCL 100 MG PO TABS: 200.0000 mg | ORAL_TABLET | Freq: Every day | ORAL | 2 refills | Status: DC

## 2018-06-30 MED ORDER — CLONAZEPAM 0.5 MG PO TABS: 0.2500 mg | ORAL_TABLET | Freq: Two times a day (BID) | ORAL | 0 refills | Status: DC | PRN

## 2018-06-30 MED ORDER — OMEPRAZOLE 40 MG PO CPDR: 40.0000 mg | DELAYED_RELEASE_CAPSULE | Freq: Every day | ORAL | 1 refills | Status: DC

## 2018-06-30 NOTE — Patient Instructions (Addendum)
It was great to talk with you today, but I am sorry you are having such a hard time. As we discussed, you are doing a wonderful job taking care of your kids during such a difficult time.  Please be sure to give yourself credit, and to treat yourself with kindness and patience  Let us increase your sertraline from 100 mg to 200 mg-we will want to do this gradually.  Increase to 150 mg, or 3 of the 50 mg pills, for 10 to 14 days.  Then you can go ahead and increase to 200 mg.  I also sent a prescription for clonazepam, this is like a longer acting Xanax.  You can use this twice a day as needed for anxiety.  Remember this can cause sedation, use caution regarding driving. Please use clonazepam or Xanax, not both  Finally, I also refilled your Prilosec for stomach acid.  Stressed concern increase stomach acid, and also risk of heartburn and ulcers. I also want to mention that using anti-inflammatories, such as naproxen, may be hard on your stomach.  This effect can be increased by medications like sertraline. Please minimize your use of naproxen, or over-the-counter Aleve or ibuprofen.  Tylenol may be a better choice for routine aches and pain  Please reply to this message in 2 or 3 weeks, let me know how you are doing.  I would like for [place] to keep in touch-hopefully we can visit in the office fairly soon  If you ever feel that you are not safe, like you might be in danger of harming yourself or others, please call 911

## 2018-08-07 ENCOUNTER — Other Ambulatory Visit: Payer: Self-pay | Admitting: Family Medicine

## 2018-08-07 DIAGNOSIS — F329 Major depressive disorder, single episode, unspecified: Secondary | ICD-10-CM

## 2018-08-07 DIAGNOSIS — F419 Anxiety disorder, unspecified: Secondary | ICD-10-CM

## 2018-08-07 DIAGNOSIS — F32A Depression, unspecified: Secondary | ICD-10-CM

## 2018-09-02 ENCOUNTER — Other Ambulatory Visit: Payer: Self-pay

## 2018-09-03 ENCOUNTER — Ambulatory Visit (INDEPENDENT_AMBULATORY_CARE_PROVIDER_SITE_OTHER): Payer: Medicaid Other | Admitting: Family Medicine

## 2018-09-03 ENCOUNTER — Encounter: Payer: Self-pay | Admitting: Family Medicine

## 2018-09-03 ENCOUNTER — Ambulatory Visit (HOSPITAL_BASED_OUTPATIENT_CLINIC_OR_DEPARTMENT_OTHER)
Admission: RE | Admit: 2018-09-03 | Discharge: 2018-09-03 | Disposition: A | Discharge status: Home / Self Care | Payer: Medicaid Other | Source: Ambulatory Visit | Attending: Family Medicine | Admitting: Family Medicine

## 2018-09-03 VITALS — BP 121/72 | HR 90 | Temp 99.2°F | Resp 18 | Ht 65.0 in | Wt 263.0 lb

## 2018-09-03 DIAGNOSIS — M25571 Pain in right ankle and joints of right foot: Secondary | ICD-10-CM | POA: Diagnosis not present

## 2018-09-03 DIAGNOSIS — M79675 Pain in left toe(s): Secondary | ICD-10-CM | POA: Diagnosis not present

## 2018-09-03 DIAGNOSIS — W108XXA Fall (on) (from) other stairs and steps, initial encounter: Secondary | ICD-10-CM

## 2018-09-03 DIAGNOSIS — K219 Gastro-esophageal reflux disease without esophagitis: Secondary | ICD-10-CM

## 2018-09-03 DIAGNOSIS — M25471 Effusion, right ankle: Secondary | ICD-10-CM | POA: Insufficient documentation

## 2018-09-03 MED ORDER — SUCRALFATE 1 G PO TABS: 1.0000 g | ORAL_TABLET | Freq: Three times a day (TID) | ORAL | 0 refills | Status: DC
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
## 2018-09-03 NOTE — Patient Instructions (Signed)
I am sorry that you fell!  Please go to the x-ray dept on the ground floor for films of your left foot and right ankle/ shin I will be in touch with these reports later today  For your stomach, we are going to add carafate (with meals and at bedtime) for about 10 days Continue your prilosec I will refer you to see GI also about your stomach! Avoid NSAIDs such as ibuprofen/ aleve as these will irritate your stomach tyelnol is ok!

## 2018-09-24 ENCOUNTER — Telehealth: Payer: Self-pay

## 2018-09-24 NOTE — Telephone Encounter (Signed)
Signed        Covid-19 screening questions   Do you now or have you had a fever in the last 14 days? NO   Do you have any respiratory symptoms of shortness of breath or cough now or in the last 14 days? NO  Do you have any family members or close contacts with diagnosed or suspected Covid-19 in the past 14 days? NO  Have you been tested for Covid-19 and found to be positive? NO   Confirmed with patient//WR

## 2018-09-24 NOTE — Telephone Encounter (Signed)
Covid-19 screening questions   Do you now or have you had a fever in the last 14 days?  Do you have any respiratory symptoms of shortness of breath or cough now or in the last 14 days?  Do you have any family members or close contacts with diagnosed or suspected Covid-19 in the past 14 days?  Have you been tested for Covid-19 and found to be positive?   Called and left voicemail for patient to call back to answer question

## 2018-09-25 ENCOUNTER — Encounter: Payer: Self-pay | Admitting: Family Medicine

## 2018-09-25 ENCOUNTER — Other Ambulatory Visit: Payer: Self-pay

## 2018-09-25 ENCOUNTER — Ambulatory Visit (INDEPENDENT_AMBULATORY_CARE_PROVIDER_SITE_OTHER): Payer: Medicaid Other | Admitting: Gastroenterology

## 2018-09-25 ENCOUNTER — Encounter: Payer: Self-pay | Admitting: Gastroenterology

## 2018-09-25 VITALS — BP 140/90 | HR 89 | Ht 65.0 in | Wt 258.0 lb

## 2018-09-25 DIAGNOSIS — R112 Nausea with vomiting, unspecified: Secondary | ICD-10-CM | POA: Diagnosis not present

## 2018-09-25 DIAGNOSIS — K921 Melena: Secondary | ICD-10-CM

## 2018-09-25 DIAGNOSIS — F329 Major depressive disorder, single episode, unspecified: Secondary | ICD-10-CM

## 2018-09-25 DIAGNOSIS — K219 Gastro-esophageal reflux disease without esophagitis: Secondary | ICD-10-CM

## 2018-09-25 DIAGNOSIS — R1013 Epigastric pain: Secondary | ICD-10-CM | POA: Diagnosis not present

## 2018-09-25 DIAGNOSIS — F32A Depression, unspecified: Secondary | ICD-10-CM

## 2018-09-25 DIAGNOSIS — F419 Anxiety disorder, unspecified: Secondary | ICD-10-CM

## 2018-09-25 NOTE — Patient Instructions (Signed)
If you are age 27 or older, your body mass index should be between 23-30. Your Body mass index is 42.93 kg/m. If this is out of the aforementioned range listed, please consider follow up with your Primary Care Provider.  If you are age 74 or younger, your body mass index should be between 19-25. Your Body mass index is 42.93 kg/m. If this is out of the aformentioned range listed, please consider follow up with your Primary Care Provider.   You have been scheduled for an endoscopy. Please follow written instructions given to you at your visit today. If you use inhalers (even only as needed), please bring them with you on the day of your procedure. Your physician has requested that you go to www.startemmi.com and enter the access code given to you at your visit today. This web site gives a general overview about your procedure. However, you should still follow specific instructions given to you by our office regarding your preparation for the procedure.  Thank you for choosing me and [facility].  Gerrit Heck, MD
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
## 2018-09-29 ENCOUNTER — Ambulatory Visit (INDEPENDENT_AMBULATORY_CARE_PROVIDER_SITE_OTHER): Payer: Medicaid Other | Admitting: Family Medicine

## 2018-09-29 ENCOUNTER — Other Ambulatory Visit: Payer: Self-pay

## 2018-09-29 ENCOUNTER — Encounter: Payer: Self-pay | Admitting: Family Medicine

## 2018-09-29 DIAGNOSIS — R1110 Vomiting, unspecified: Secondary | ICD-10-CM | POA: Diagnosis not present

## 2018-09-29 DIAGNOSIS — F4323 Adjustment disorder with mixed anxiety and depressed mood: Secondary | ICD-10-CM | POA: Diagnosis not present

## 2018-09-29 DIAGNOSIS — F419 Anxiety disorder, unspecified: Secondary | ICD-10-CM | POA: Diagnosis not present

## 2018-09-29 DIAGNOSIS — Z638 Other specified problems related to primary support group: Secondary | ICD-10-CM

## 2018-09-29 DIAGNOSIS — F329 Major depressive disorder, single episode, unspecified: Secondary | ICD-10-CM

## 2018-09-29 MED ORDER — ONDANSETRON 4 MG PO TBDP: 4.0000 mg | ORAL_TABLET | Freq: Three times a day (TID) | ORAL | 0 refills | Status: DC | PRN

## 2018-09-29 MED ORDER — CLONAZEPAM 0.5 MG PO TABS: ORAL_TABLET | ORAL | 2 refills | Status: DC

## 2018-10-06 ENCOUNTER — Telehealth: Payer: Self-pay | Admitting: Gastroenterology

## 2018-10-06 NOTE — Telephone Encounter (Signed)

## 2018-10-07 ENCOUNTER — Encounter: Payer: Self-pay | Admitting: Gastroenterology

## 2018-10-07 ENCOUNTER — Other Ambulatory Visit: Payer: Self-pay

## 2018-10-07 ENCOUNTER — Ambulatory Visit (AMBULATORY_SURGERY_CENTER): Payer: Medicaid Other | Admitting: Gastroenterology

## 2018-10-07 VITALS — BP 126/75 | HR 74 | Temp 97.7°F | Resp 20 | Ht 65.0 in | Wt 258.0 lb

## 2018-10-07 DIAGNOSIS — K921 Melena: Secondary | ICD-10-CM

## 2018-10-07 DIAGNOSIS — R12 Heartburn: Secondary | ICD-10-CM

## 2018-10-07 DIAGNOSIS — R112 Nausea with vomiting, unspecified: Secondary | ICD-10-CM | POA: Diagnosis not present

## 2018-10-07 DIAGNOSIS — R1013 Epigastric pain: Secondary | ICD-10-CM | POA: Diagnosis not present

## 2018-10-07 DIAGNOSIS — K219 Gastro-esophageal reflux disease without esophagitis: Secondary | ICD-10-CM

## 2018-10-07 MED ORDER — SODIUM CHLORIDE 0.9 % IV SOLN: 500.0000 mL | Freq: Once | INTRAVENOUS | Status: DC

## 2018-10-07 NOTE — Progress Notes (Signed)
Pt's states no medical or surgical changes since previsit or office visit. 

## 2018-10-07 NOTE — Op Note (Signed)
[facility] Patient Name: Sandy Perez Procedure Date: 10/07/2018 12:00 PM MRN: [id number] Endoscopist: Doristine LocksVito Katiejo Gilroy , MD Age: [age] Referring MD:  Date of Birth: 02/12/1991 Gender: Female Account #: [id number] Procedure:                Upper GI endoscopy Indications:              Epigastric abdominal pain, Heartburn, Suspected                            esophageal reflux, Nausea with vomiting Medicines:                Monitored Anesthesia Care Procedure:                Pre-Anesthesia Assessment:                           - Prior to the procedure, a History and Physical                            was performed, and patient medications and                            allergies were reviewed. The patient's tolerance of                            previous anesthesia was also reviewed. The risks                            and benefits of the procedure and the sedation                            options and risks were discussed with the patient.                            All questions were answered, and informed consent                            was obtained. Prior Anticoagulants: The patient has                            taken no previous anticoagulant or antiplatelet                            agents. ASA Grade Assessment: II - A patient with                            mild systemic disease. After reviewing the risks                            and benefits, the patient was deemed in                            satisfactory condition to undergo the procedure.  After obtaining informed consent, the endoscope was                            passed under direct vision. Throughout the                            procedure, the patient's blood pressure, pulse, and                            oxygen saturations were monitored continuously. The                            Endoscope was introduced through the mouth, and                            advanced to the  second part of duodenum. The upper                            GI endoscopy was accomplished without difficulty.                            The patient tolerated the procedure well. Scope In: Scope Out: Findings:                 The examined esophagus was normal.                           The Z-line was regular and was found 37 cm from the                            incisors.                           The entire examined stomach was normal. Biopsies                            were taken with a cold forceps for Helicobacter                            pylori testing. Estimated blood loss was minimal.                           The duodenal bulb, first portion of the duodenum                            and second portion of the duodenum were normal. Complications:            No immediate complications. Estimated Blood Loss:     Estimated blood loss was minimal. Impression:               - Normal esophagus.                           - Z-line regular, 37 cm from the incisors.                           -  Normal stomach. Biopsied.                           - Normal duodenal bulb, first portion of the                            duodenum and second portion of the duodenum. Recommendation:           - Patient has a contact number available for                            emergencies. The signs and symptoms of potential                            delayed complications were discussed with the                            patient. Return to normal activities tomorrow.                            Written discharge instructions were provided to the                            patient.                           - Resume previous diet today.                           - Continue present medications.                           - Await pathology results.                           - Return to GI clinic to be scheduled.                           - If ongoing/recurrent hematochezia, may need to                             consider colonoscopy to evaluate further for                            etiology.                           - If ongoing reflux symptoms, may need to consider                            further evaluation with pH/Impedance testing (off                            PPI therapy). Gerrit Heck, MD 10/07/2018 12:20:31 PM

## 2018-10-07 NOTE — Progress Notes (Signed)
Called to room to assist during endoscopic procedure.  Patient ID and intended procedure confirmed with present staff. Received instructions for my participation in the procedure from the performing physician.  

## 2018-10-07 NOTE — Patient Instructions (Signed)
  Biopsies taken today. Results mailed to you in 2-3 weeks. Follow up with your doctor as directed.  YOU HAD AN ENDOSCOPIC PROCEDURE TODAY AT [facility]:   Refer to the procedure report that was given to you for any specific questions about what was found during the examination.  If the procedure report does not answer your questions, please call your gastroenterologist to clarify.  If you requested that your care partner not be given the details of your procedure findings, then the procedure report has been included in a sealed envelope for you to review at your convenience later.  YOU SHOULD EXPECT: Some feelings of bloating in the abdomen. Passage of more gas than usual.  Walking can help get rid of the air that was put into your GI tract during the procedure and reduce the bloating. If you had a lower endoscopy (such as a colonoscopy or flexible sigmoidoscopy) you may notice spotting of blood in your stool or on the toilet paper. If you underwent a bowel prep for your procedure, you may not have a normal bowel movement for a few days.  Please Note:  You might notice some irritation and congestion in your nose or some drainage.  This is from the oxygen used during your procedure.  There is no need for concern and it should clear up in a day or so.  SYMPTOMS TO REPORT IMMEDIATELY:   Following upper endoscopy (EGD)  Vomiting of blood or coffee ground material  New chest pain or pain under the shoulder blades  Painful or persistently difficult swallowing  New shortness of breath  Fever of 100F or higher  Black, tarry-looking stools  For urgent or emergent issues, a gastroenterologist can be reached at any hour by calling [phone].   DIET:  We do recommend a small meal at first, but then you may proceed to your regular diet.  Drink plenty of fluids but you should avoid alcoholic beverages for 24 hours.  ACTIVITY:  You should plan to take it easy for the rest of today  and you should NOT DRIVE or use heavy machinery until tomorrow (because of the sedation medicines used during the test).    FOLLOW UP: Our staff will call the number listed on your records 48-72 hours following your procedure to check on you and address any questions or concerns that you may have regarding the information given to you following your procedure. If we do not reach you, we will leave a message.  We will attempt to reach you two times.  During this call, we will ask if you have developed any symptoms of COVID 19. If you develop any symptoms (ie: fever, flu-like symptoms, shortness of breath, cough etc.) before then, please call [phone].  If you test positive for Covid 19 in the 2 weeks post procedure, please call and report this information to [place].    If any biopsies were taken you will be contacted by phone or by letter within the next 1-3 weeks.  Please call us at [phone] if you have not heard about the biopsies in 3 weeks.    SIGNATURES/CONFIDENTIALITY: You and/or your care partner have signed paperwork which will be entered into your electronic medical record.  These signatures attest to the fact that that the information above on your After Visit Summary has been reviewed and is understood.  Full responsibility of the confidentiality of this discharge information lies with you and/or your care-partner.

## 2018-10-09 ENCOUNTER — Telehealth: Payer: Self-pay

## 2018-10-09 NOTE — Telephone Encounter (Signed)
  Follow up Call-  Call back number 10/07/2018  Post procedure Call Back phone  # [phone]  Permission to leave phone message Yes  Some recent data might be hidden     Patient questions:  Do you have a fever, pain , or abdominal swelling? No. Pain Score  0   Have you tolerated food without any problems? Yes.    Have you been able to return to your normal activities? Yes.    Do you have any questions about your discharge instructions: Diet   No. Medications  No. Follow up visit  No.  Do you have questions or concerns about your Care? No.  Actions: * If pain score is 4 or above: No action needed, pain <4.    1. Have you developed a fever since your procedure? no  2.   Have you had an respiratory symptoms (SOB or cough) since your procedure? no  3.   Have you tested positive for COVID 19 since your procedure no  4.   Have you had any family members/close contacts diagnosed with the COVID 19 since your procedure?  no   If yes to any of these questions please route to Joylene John, RN and Alphonsa Gin, Therapist, sports.

## 2018-10-09 NOTE — Telephone Encounter (Signed)
Left message for Sandy Perez-Sandy Perez has been scheduled for a f/u OV with Dr. Bryan Lemma on 11/04/2018 at 10:40 am; Sandy Perez advised to call back to the office if this date/time do not work for her;

## 2018-10-14 ENCOUNTER — Encounter: Payer: Self-pay | Admitting: Gastroenterology

## 2018-10-15 ENCOUNTER — Encounter: Payer: Self-pay | Admitting: Family Medicine

## 2018-10-16 ENCOUNTER — Emergency Department (HOSPITAL_BASED_OUTPATIENT_CLINIC_OR_DEPARTMENT_OTHER)
Admission: EM | Admit: 2018-10-16 | Discharge: 2018-10-16 | Disposition: A | Discharge status: Home / Self Care | Payer: Medicaid Other | Attending: Emergency Medicine | Admitting: Emergency Medicine

## 2018-10-16 ENCOUNTER — Encounter (HOSPITAL_BASED_OUTPATIENT_CLINIC_OR_DEPARTMENT_OTHER): Payer: Self-pay | Admitting: Emergency Medicine

## 2018-10-16 ENCOUNTER — Other Ambulatory Visit: Payer: Self-pay

## 2018-10-16 DIAGNOSIS — Z79899 Other long term (current) drug therapy: Secondary | ICD-10-CM | POA: Diagnosis not present

## 2018-10-16 DIAGNOSIS — G44209 Tension-type headache, unspecified, not intractable: Secondary | ICD-10-CM

## 2018-10-16 DIAGNOSIS — R51 Headache: Secondary | ICD-10-CM | POA: Diagnosis present

## 2018-10-16 LAB — PREGNANCY, URINE: Preg Test, Ur: NEGATIVE

## 2018-10-16 MED ORDER — DIVALPROEX SODIUM ER 250 MG PO TB24
ORAL_TABLET | ORAL | Status: AC
  Administered 2018-10-16: 05:00:00 500 mg
  Filled 2018-10-16: qty 2

## 2018-10-16 MED ORDER — DEXAMETHASONE SODIUM PHOSPHATE 10 MG/ML IJ SOLN
10.0000 mg | Freq: Once | INTRAMUSCULAR | Status: AC
  Administered 2018-10-16: 10 mg via INTRAVENOUS
  Filled 2018-10-16: qty 1

## 2018-10-16 MED ORDER — ONDANSETRON HCL 4 MG/2ML IJ SOLN
4.0000 mg | Freq: Once | INTRAMUSCULAR | Status: AC
  Administered 2018-10-16: 4 mg via INTRAVENOUS
  Filled 2018-10-16: qty 2

## 2018-10-16 MED ORDER — MAGNESIUM SULFATE 2 GM/50ML IV SOLN
2.0000 g | Freq: Once | INTRAVENOUS | Status: AC
  Administered 2018-10-16: 2 g via INTRAVENOUS
  Filled 2018-10-16: qty 50

## 2018-10-16 MED ORDER — BUTALBITAL-APAP-CAFFEINE 50-325-40 MG PO TABS: 1.0000 | ORAL_TABLET | Freq: Four times a day (QID) | ORAL | 0 refills | Status: AC | PRN

## 2018-10-16 MED ORDER — METOCLOPRAMIDE HCL 10 MG PO TABS: 10.0000 mg | ORAL_TABLET | Freq: Three times a day (TID) | ORAL | 0 refills | Status: DC | PRN

## 2018-10-16 MED ORDER — DIVALPROEX SODIUM 500 MG PO DR TAB
500.0000 mg | DELAYED_RELEASE_TABLET | Freq: Two times a day (BID) | ORAL | Status: DC
  Filled 2018-10-16: qty 1

## 2018-10-16 MED ORDER — KETOROLAC TROMETHAMINE 30 MG/ML IJ SOLN
15.0000 mg | Freq: Once | INTRAMUSCULAR | Status: AC
  Administered 2018-10-16: 15 mg via INTRAVENOUS
  Filled 2018-10-16: qty 1

## 2018-10-16 NOTE — ED Notes (Signed)
Pt vomited approximately 300 ml of liquid
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
## 2018-10-16 NOTE — ED Triage Notes (Signed)
Pt is c/o migraine headache  States she has been having it off and on for about 2 weeks  Tonight the pain is worse and she has had nausea and vomiting associated with the pain  Took tylenol about an hour ago without relief

## 2018-10-16 NOTE — Telephone Encounter (Signed)
FYI  Patient went to ED 

## 2018-11-04 ENCOUNTER — Ambulatory Visit: Payer: Medicaid Other | Admitting: Gastroenterology

## 2018-11-17 ENCOUNTER — Encounter: Payer: Self-pay | Admitting: Family Medicine

## 2018-11-17 DIAGNOSIS — G43709 Chronic migraine without aura, not intractable, without status migrainosus: Secondary | ICD-10-CM

## 2018-11-18 MED ORDER — TOPIRAMATE 25 MG PO TABS: 50.0000 mg | ORAL_TABLET | Freq: Two times a day (BID) | ORAL | 3 refills | Status: DC

## 2018-11-18 NOTE — Addendum Note (Signed)
Addended by: Lamar Blinks C on: 11/18/2018 07:45 AM   Modules accepted: Orders

## 2018-11-27 ENCOUNTER — Other Ambulatory Visit: Payer: Self-pay | Admitting: Family Medicine

## 2019-02-19 ENCOUNTER — Other Ambulatory Visit: Payer: Self-pay
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
## 2019-02-23 ENCOUNTER — Other Ambulatory Visit: Payer: Self-pay

## 2019-02-23 ENCOUNTER — Ambulatory Visit (HOSPITAL_BASED_OUTPATIENT_CLINIC_OR_DEPARTMENT_OTHER)
Admission: RE | Admit: 2019-02-23 | Discharge: 2019-02-23 | Disposition: A | Discharge status: Home / Self Care | Payer: Medicaid Other | Source: Ambulatory Visit | Attending: Family Medicine | Admitting: Family Medicine

## 2019-02-23 ENCOUNTER — Encounter: Payer: Self-pay | Admitting: Family Medicine

## 2019-02-23 ENCOUNTER — Ambulatory Visit: Payer: Medicaid Other | Admitting: Family Medicine

## 2019-02-23 VITALS — BP 126/88 | HR 92 | Temp 97.6°F | Resp 17 | Ht 65.0 in | Wt 276.0 lb

## 2019-02-23 DIAGNOSIS — Z13220 Encounter for screening for lipoid disorders: Secondary | ICD-10-CM | POA: Diagnosis not present

## 2019-02-23 DIAGNOSIS — E559 Vitamin D deficiency, unspecified: Secondary | ICD-10-CM

## 2019-02-23 DIAGNOSIS — M25551 Pain in right hip: Secondary | ICD-10-CM | POA: Diagnosis present

## 2019-02-23 DIAGNOSIS — R5383 Other fatigue: Secondary | ICD-10-CM

## 2019-02-23 DIAGNOSIS — R635 Abnormal weight gain: Secondary | ICD-10-CM

## 2019-02-23 DIAGNOSIS — Z131 Encounter for screening for diabetes mellitus: Secondary | ICD-10-CM | POA: Diagnosis not present

## 2019-02-23 DIAGNOSIS — R7303 Prediabetes: Secondary | ICD-10-CM

## 2019-02-23 DIAGNOSIS — L732 Hidradenitis suppurativa: Secondary | ICD-10-CM

## 2019-02-23 DIAGNOSIS — R202 Paresthesia of skin: Secondary | ICD-10-CM | POA: Diagnosis not present

## 2019-02-23 LAB — CBC
HCT: 36.1 % (ref 36.0–46.0)
Hemoglobin: 11.8 g/dL — ABNORMAL LOW (ref 12.0–15.0)
MCHC: 32.8 g/dL (ref 30.0–36.0)
MCV: 81.7 fl (ref 78.0–100.0)
Platelets: 286 10*3/uL (ref 150.0–400.0)
RBC: 4.42 Mil/uL (ref 3.87–5.11)
RDW: 18.8 % — ABNORMAL HIGH (ref 11.5–15.5)
WBC: 8 10*3/uL (ref 4.0–10.5)

## 2019-02-23 LAB — LDL CHOLESTEROL, DIRECT: Direct LDL: 68 mg/dL

## 2019-02-23 LAB — LIPID PANEL
Cholesterol: 172 mg/dL (ref 0–200)
HDL: 35.2 mg/dL — ABNORMAL LOW (ref >39.00)
NonHDL: 136.92
Total CHOL/HDL Ratio: 5
Triglycerides: 224 mg/dL — ABNORMAL HIGH (ref 0.0–149.0)
VLDL: 44.8 mg/dL — ABNORMAL HIGH (ref 0.0–40.0)

## 2019-02-23 LAB — COMPREHENSIVE METABOLIC PANEL
ALT: 14 U/L (ref 0–35)
AST: 12 U/L (ref 0–37)
Albumin: 4.1 g/dL (ref 3.5–5.2)
Alkaline Phosphatase: 92 U/L (ref 39–117)
BUN: 8 mg/dL (ref 6–23)
CO2: 27 mEq/L (ref 19–32)
Calcium: 9.4 mg/dL (ref 8.4–10.5)
Chloride: 105 mEq/L (ref 96–112)
Creatinine, Ser: 0.56 mg/dL (ref 0.40–1.20)
GFR: 129.68 mL/min (ref >60.00)
Glucose, Bld: 88 mg/dL (ref 70–99)
Potassium: 4.3 mEq/L (ref 3.5–5.1)
Sodium: 138 mEq/L (ref 135–145)
Total Bilirubin: 0.2 mg/dL (ref 0.2–1.2)
Total Protein: 6.7 g/dL (ref 6.0–8.3)

## 2019-02-23 LAB — HEMOGLOBIN A1C: Hgb A1c MFr Bld: 6.2 % (ref 4.6–6.5)

## 2019-02-23 LAB — TSH: TSH: 0.77 u[IU]/mL (ref 0.35–4.50)

## 2019-02-23 LAB — VITAMIN B12: Vitamin B-12: 155 pg/mL — ABNORMAL LOW (ref 211–911)

## 2019-02-23 LAB — VITAMIN D 25 HYDROXY (VIT D DEFICIENCY, FRACTURES): VITD: 9.81 ng/mL — ABNORMAL LOW (ref 30.00–100.00)

## 2019-02-23 LAB — FOLATE: Folate: 6.6 ng/mL (ref >5.9)

## 2019-02-23 MED ORDER — DOXYCYCLINE HYCLATE 100 MG PO CAPS: 100.0000 mg | ORAL_CAPSULE | Freq: Two times a day (BID) | ORAL | 0 refills | Status: DC

## 2019-02-23 MED ORDER — VITAMIN D3 1.25 MG (50000 UT) PO CAPS: ORAL_CAPSULE | ORAL | 0 refills | Status: DC

## 2019-02-23 NOTE — Patient Instructions (Signed)
It was great to see you again today, I hope that you have a good new year For your boils, please try to avoid picking.  I would also suggest keeping your skin well moisturized to avoid cracking and dryness.  I sent in a prescription for doxycycline antibiotic, take it twice a day for 10 days Weight loss will also help decrease incidence of boils  Please go to the lab and have your blood drawn, and then downstairs to have an x-ray of your hip I will be in touch with your results ASAP  You are doing a great job taking care of your family!

## 2019-02-23 NOTE — Addendum Note (Signed)
Addended by: Abbe Amsterdam C on: 02/23/2019 05:20 PM   Modules accepted: Orders

## 2019-03-09 ENCOUNTER — Other Ambulatory Visit: Payer: Self-pay | Admitting: Family Medicine

## 2019-03-09 DIAGNOSIS — F329 Major depressive disorder, single episode, unspecified: Secondary | ICD-10-CM

## 2019-03-09 DIAGNOSIS — F419 Anxiety disorder, unspecified: Secondary | ICD-10-CM

## 2019-03-09 NOTE — Telephone Encounter (Signed)
Requesting:Clonazepam Contract:none OEV:[id number] Last Visit: 02/23/2019 Next Visit:none Last Refill:09/29/2018  Please Advise

## 2019-04-03 ENCOUNTER — Other Ambulatory Visit: Payer: Self-pay | Admitting: Family Medicine

## 2019-04-03 DIAGNOSIS — K219 Gastro-esophageal reflux disease without esophagitis: Secondary | ICD-10-CM

## 2019-05-28 ENCOUNTER — Other Ambulatory Visit: Payer: Self-pay | Admitting: Family Medicine

## 2019-07-08 ENCOUNTER — Other Ambulatory Visit: Payer: Self-pay | Admitting: Family Medicine

## 2019-07-08 DIAGNOSIS — F32A Depression, unspecified: Secondary | ICD-10-CM

## 2019-07-08 NOTE — Telephone Encounter (Signed)
RF request for clonazepam.  Last OV 02/23/19 Next OV - none Last RX 03/09/19 # 60 x 2 RF Please advise.

## 2019-08-19 ENCOUNTER — Other Ambulatory Visit: Payer: Self-pay

## 2019-08-19 MED ORDER — ALBUTEROL SULFATE HFA 108 (90 BASE) MCG/ACT IN AERS: INHALATION_SPRAY | RESPIRATORY_TRACT | 6 refills | Status: DC

## 2019-08-24 ENCOUNTER — Encounter: Payer: Self-pay | Admitting: Family Medicine

## 2019-09-14 ENCOUNTER — Other Ambulatory Visit: Payer: Self-pay | Admitting: Family Medicine

## 2019-09-14 ENCOUNTER — Other Ambulatory Visit: Payer: Self-pay

## 2019-09-14 DIAGNOSIS — F4323 Adjustment disorder with mixed anxiety and depressed mood: Secondary | ICD-10-CM

## 2019-09-14 MED ORDER — SERTRALINE HCL 100 MG PO TABS: 200.0000 mg | ORAL_TABLET | Freq: Every day | ORAL | 1 refills | Status: DC

## 2019-10-01 ENCOUNTER — Encounter: Payer: Self-pay | Admitting: Emergency Medicine

## 2019-10-01 ENCOUNTER — Other Ambulatory Visit: Payer: Self-pay

## 2019-10-01 ENCOUNTER — Emergency Department (INDEPENDENT_AMBULATORY_CARE_PROVIDER_SITE_OTHER)
Admission: EM | Admit: 2019-10-01 | Discharge: 2019-10-01 | Disposition: A | Discharge status: Home / Self Care | Payer: Medicaid Other | Source: Home / Self Care

## 2019-10-01 DIAGNOSIS — J069 Acute upper respiratory infection, unspecified: Secondary | ICD-10-CM

## 2019-10-01 DIAGNOSIS — Z9189 Other specified personal risk factors, not elsewhere classified: Secondary | ICD-10-CM

## 2019-10-01 MED ORDER — BENZONATATE 100 MG PO CAPS: 100.0000 mg | ORAL_CAPSULE | Freq: Three times a day (TID) | ORAL | 0 refills | Status: DC

## 2019-10-01 MED ORDER — IPRATROPIUM BROMIDE 0.06 % NA SOLN: 2.0000 | Freq: Four times a day (QID) | NASAL | 1 refills | Status: DC

## 2019-10-01 NOTE — ED Triage Notes (Signed)
Cough/ congestion/sore throat since yesterday- Cough made her feel SOB Has inhaler for anxiety attacks not asthma per pt  Denies fever 1st Moderna vaccine was 09/23/19 Nyquil last night

## 2019-10-01 NOTE — Discharge Instructions (Signed)
  You may take 500mg acetaminophen every 4-6 hours or in combination with ibuprofen 400-600mg every 6-8 hours as needed for pain, inflammation, and fever.  Be sure to well hydrated with clear liquids and get at least 8 hours of sleep at night, preferably more while sick.   Please follow up with family medicine in 1 week if needed.   
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
## 2019-10-02 ENCOUNTER — Encounter: Payer: Self-pay | Admitting: Family Medicine

## 2019-10-02 LAB — SARS-COV-2, NAA 2 DAY TAT

## 2019-10-02 LAB — NOVEL CORONAVIRUS, NAA: SARS-CoV-2, NAA: NOT DETECTED

## 2019-11-18 ENCOUNTER — Encounter: Payer: Self-pay | Admitting: Family Medicine
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
## 2019-11-25 ENCOUNTER — Ambulatory Visit: Payer: Medicaid Other | Admitting: Family Medicine

## 2019-11-30 ENCOUNTER — Other Ambulatory Visit: Payer: Self-pay | Admitting: Family Medicine

## 2019-11-30 ENCOUNTER — Encounter: Payer: Self-pay | Admitting: Family Medicine

## 2019-11-30 DIAGNOSIS — F419 Anxiety disorder, unspecified: Secondary | ICD-10-CM
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
## 2019-12-06 ENCOUNTER — Encounter: Payer: Self-pay | Admitting: Family Medicine

## 2019-12-07 ENCOUNTER — Ambulatory Visit: Payer: Medicaid Other | Admitting: Family Medicine

## 2019-12-21 ENCOUNTER — Encounter: Payer: Medicaid Other | Admitting: Family Medicine

## 2019-12-21 ENCOUNTER — Other Ambulatory Visit: Payer: Self-pay

## 2019-12-21 ENCOUNTER — Other Ambulatory Visit: Payer: Self-pay | Admitting: Family Medicine

## 2019-12-21 ENCOUNTER — Ambulatory Visit (INDEPENDENT_AMBULATORY_CARE_PROVIDER_SITE_OTHER): Payer: Medicaid Other | Admitting: Family Medicine

## 2019-12-21 ENCOUNTER — Encounter: Payer: Self-pay | Admitting: Family Medicine

## 2019-12-21 VITALS — BP 126/84 | HR 91 | Resp 18 | Ht 65.0 in | Wt 271.0 lb

## 2019-12-21 DIAGNOSIS — Z131 Encounter for screening for diabetes mellitus: Secondary | ICD-10-CM

## 2019-12-21 DIAGNOSIS — Z0000 Encounter for general adult medical examination without abnormal findings: Secondary | ICD-10-CM

## 2019-12-21 DIAGNOSIS — R5383 Other fatigue: Secondary | ICD-10-CM

## 2019-12-21 DIAGNOSIS — E559 Vitamin D deficiency, unspecified: Secondary | ICD-10-CM | POA: Diagnosis not present

## 2019-12-21 DIAGNOSIS — Z13220 Encounter for screening for lipoid disorders: Secondary | ICD-10-CM | POA: Diagnosis not present

## 2019-12-21 DIAGNOSIS — Z1159 Encounter for screening for other viral diseases: Secondary | ICD-10-CM

## 2019-12-21 DIAGNOSIS — Z130 Encounter for screening for diseases of the blood and blood-forming organs and certain disorders involving the immune mechanism: Secondary | ICD-10-CM

## 2019-12-21 DIAGNOSIS — R5382 Chronic fatigue, unspecified: Secondary | ICD-10-CM

## 2019-12-21 DIAGNOSIS — E538 Deficiency of other specified B group vitamins: Secondary | ICD-10-CM

## 2019-12-21 DIAGNOSIS — F4323 Adjustment disorder with mixed anxiety and depressed mood: Secondary | ICD-10-CM

## 2019-12-21 MED ORDER — SERTRALINE HCL 100 MG PO TABS: 200.0000 mg | ORAL_TABLET | Freq: Every day | ORAL | 3 refills | Status: DC
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
## 2019-12-21 NOTE — Patient Instructions (Addendum)
Great to see you again today!  Take care and I will be in touch with your labs We can plan for a sleep apnea evaluation for you to check on your snoring and fatigue- I will set this up Please get your flu shot and 2nd covid vaccine asap!   Let me know when you need more clonazepam for anxiety    Health Maintenance, Female Adopting a healthy lifestyle and getting preventive care are important in promoting health and wellness. Ask your health care provider about:  The right schedule for you to have regular tests and exams.  Things you can do on your own to prevent diseases and keep yourself healthy. What should I know about diet, weight, and exercise? Eat a healthy diet   Eat a diet that includes plenty of vegetables, fruits, low-fat dairy products, and lean protein.  Do not eat a lot of foods that are high in solid fats, added sugars, or sodium. Maintain a healthy weight Body mass index (BMI) is used to identify weight problems. It estimates body fat based on height and weight. Your health care provider can help determine your BMI and help you achieve or maintain a healthy weight. Get regular exercise Get regular exercise. This is one of the most important things you can do for your health. Most adults should:  Exercise for at least 150 minutes each week. The exercise should increase your heart rate and make you sweat (moderate-intensity exercise).  Do strengthening exercises at least twice a week. This is in addition to the moderate-intensity exercise.  Spend less time sitting. Even light physical activity can be beneficial. Watch cholesterol and blood lipids Have your blood tested for lipids and cholesterol at 28 years of age, then have this test every 5 years. Have your cholesterol levels checked more often if:  Your lipid or cholesterol levels are high.  You are older than 28 years of age.  You are at high risk for heart disease. What should I know about cancer  screening? Depending on your health history and family history, you may need to have cancer screening at various ages. This may include screening for:  Breast cancer.  Cervical cancer.  Colorectal cancer.  Skin cancer.  Lung cancer. What should I know about heart disease, diabetes, and high blood pressure? Blood pressure and heart disease  High blood pressure causes heart disease and increases the risk of stroke. This is more likely to develop in people who have high blood pressure readings, are of African descent, or are overweight.  Have your blood pressure checked: ? Every 3-5 years if you are 61-60 years of age. ? Every year if you are 61 years old or older. Diabetes Have regular diabetes screenings. This checks your fasting blood sugar level. Have the screening done:  Once every three years after age 92 if you are at a normal weight and have a low risk for diabetes.  More often and at a younger age if you are overweight or have a high risk for diabetes. What should I know about preventing infection? Hepatitis B If you have a higher risk for hepatitis B, you should be screened for this virus. Talk with your health care provider to find out if you are at risk for hepatitis B infection. Hepatitis C Testing is recommended for:  Everyone born from [birth year] through 1964.  Anyone with known risk factors for hepatitis C. Sexually transmitted infections (STIs)  Get screened for STIs, including gonorrhea and chlamydia, if: ?  You are sexually active and are younger than 28 years of age. ? You are older than 28 years of age and your health care provider tells you that you are at risk for this type of infection. ? Your sexual activity has changed since you were last screened, and you are at increased risk for chlamydia or gonorrhea. Ask your health care provider if you are at risk.  Ask your health care provider about whether you are at high risk for HIV. Your health care provider may  recommend a prescription medicine to help prevent HIV infection. If you choose to take medicine to prevent HIV, you should first get tested for HIV. You should then be tested every 3 months for as long as you are taking the medicine. Pregnancy  If you are about to stop having your period (premenopausal) and you may become pregnant, seek counseling before you get pregnant.  Take 400 to 800 micrograms (mcg) of folic acid every day if you become pregnant.  Ask for birth control (contraception) if you want to prevent pregnancy. Osteoporosis and menopause Osteoporosis is a disease in which the bones lose minerals and strength with aging. This can result in bone fractures. If you are 66 years old or older, or if you are at risk for osteoporosis and fractures, ask your health care provider if you should:  Be screened for bone loss.  Take a calcium or vitamin D supplement to lower your risk of fractures.  Be given hormone replacement therapy (HRT) to treat symptoms of menopause. Follow these instructions at home: Lifestyle  Do not use any products that contain nicotine or tobacco, such as cigarettes, e-cigarettes, and chewing tobacco. If you need help quitting, ask your health care provider.  Do not use street drugs.  Do not share needles.  Ask your health care provider for help if you need support or information about quitting drugs. Alcohol use  Do not drink alcohol if: ? Your health care provider tells you not to drink. ? You are pregnant, may be pregnant, or are planning to become pregnant.  If you drink alcohol: ? Limit how much you use to 0-1 drink a day. ? Limit intake if you are breastfeeding.  Be aware of how much alcohol is in your drink. In the [place] one drink equals one 12 oz bottle of beer (355 mL), one 5 oz glass of wine (148 mL), or one 1 oz glass of hard liquor (44 mL). General instructions  Schedule regular health, dental, and eye exams.  Stay current with your  vaccines.  Tell your health care provider if: ? You often feel depressed. ? You have ever been abused or do not feel safe at home. Summary  Adopting a healthy lifestyle and getting preventive care are important in promoting health and wellness.  Follow your health care provider's instructions about healthy diet, exercising, and getting tested or screened for diseases.  Follow your health care provider's instructions on monitoring your cholesterol and blood pressure. This information is not intended to replace advice given to you by your health care provider. Make sure you discuss any questions you have with your health care provider. Document Revised: 01/29/2018 Document Reviewed: 01/29/2018 Elsevier Patient Education  2020 [employer].

## 2019-12-22 ENCOUNTER — Encounter: Payer: Self-pay | Admitting: Family Medicine

## 2019-12-22 ENCOUNTER — Other Ambulatory Visit (HOSPITAL_BASED_OUTPATIENT_CLINIC_OR_DEPARTMENT_OTHER): Payer: Self-pay | Admitting: Family Medicine

## 2019-12-22 LAB — HEMOGLOBIN A1C
Hgb A1c MFr Bld: 5.9 % of total Hgb — ABNORMAL HIGH (ref <5.7)
Mean Plasma Glucose: 123 (calc)
eAG (mmol/L): 6.8 (calc)

## 2019-12-22 LAB — HEPATITIS C ANTIBODY
Hepatitis C Ab: NONREACTIVE
SIGNAL TO CUT-OFF: 0.02 (ref <1.00)

## 2019-12-22 LAB — COMPREHENSIVE METABOLIC PANEL
AG Ratio: 1.6 (calc) (ref 1.0–2.5)
ALT: 13 U/L (ref 6–29)
AST: 11 U/L (ref 10–30)
Albumin: 4.2 g/dL (ref 3.6–5.1)
Alkaline phosphatase (APISO): 96 U/L (ref 31–125)
BUN: 10 mg/dL (ref 7–25)
CO2: 24 mmol/L (ref 20–32)
Calcium: 10.2 mg/dL (ref 8.6–10.2)
Chloride: 104 mmol/L (ref 98–110)
Creat: 0.69 mg/dL (ref 0.50–1.10)
Globulin: 2.7 g/dL (calc) (ref 1.9–3.7)
Glucose, Bld: 103 mg/dL — ABNORMAL HIGH (ref 65–99)
Potassium: 4.6 mmol/L (ref 3.5–5.3)
Sodium: 136 mmol/L (ref 135–146)
Total Bilirubin: 0.2 mg/dL (ref 0.2–1.2)
Total Protein: 6.9 g/dL (ref 6.1–8.1)

## 2019-12-22 LAB — CBC
HCT: 35.5 % (ref 35.0–45.0)
Hemoglobin: 11.3 g/dL — ABNORMAL LOW (ref 11.7–15.5)
MCH: 25.7 pg — ABNORMAL LOW (ref 27.0–33.0)
MCHC: 31.8 g/dL — ABNORMAL LOW (ref 32.0–36.0)
MCV: 80.7 fL (ref 80.0–100.0)
MPV: 9.6 fL (ref 7.5–12.5)
Platelets: 271 10*3/uL (ref 140–400)
RBC: 4.4 10*6/uL (ref 3.80–5.10)
RDW: 15.8 % — ABNORMAL HIGH (ref 11.0–15.0)
WBC: 7.7 10*3/uL (ref 3.8–10.8)

## 2019-12-22 LAB — LIPID PANEL
Cholesterol: 174 mg/dL (ref <200)
HDL: 32 mg/dL — ABNORMAL LOW (ref >=50)
LDL Cholesterol (Calc): 100 mg/dL (calc) — ABNORMAL HIGH
Non-HDL Cholesterol (Calc): 142 mg/dL (calc) — ABNORMAL HIGH (ref <130)
Total CHOL/HDL Ratio: 5.4 (calc) — ABNORMAL HIGH (ref <5.0)
Triglycerides: 291 mg/dL — ABNORMAL HIGH (ref <150)

## 2019-12-22 LAB — VITAMIN B12: Vitamin B-12: 268 pg/mL (ref 200–1100)

## 2019-12-22 LAB — VITAMIN D 25 HYDROXY (VIT D DEFICIENCY, FRACTURES): Vit D, 25-Hydroxy: 11 ng/mL — ABNORMAL LOW (ref 30–100)

## 2019-12-22 LAB — TSH: TSH: 0.78 mIU/L

## 2019-12-22 MED ORDER — VITAMIN D3 1.25 MG (50000 UT) PO CAPS: ORAL_CAPSULE | ORAL | 0 refills | Status: DC

## 2019-12-22 NOTE — Addendum Note (Signed)
Addended by: Abbe Amsterdam C on: 12/22/2019 01:03 PM   Modules accepted: Orders

## 2019-12-25 ENCOUNTER — Other Ambulatory Visit: Payer: Self-pay | Admitting: Family Medicine

## 2019-12-25 DIAGNOSIS — K219 Gastro-esophageal reflux disease without esophagitis: Secondary | ICD-10-CM

## 2020-01-10 ENCOUNTER — Telehealth: Payer: Medicaid Other | Admitting: Family

## 2020-01-10 DIAGNOSIS — A084 Viral intestinal infection, unspecified: Secondary | ICD-10-CM | POA: Diagnosis not present

## 2020-01-10 MED ORDER — ONDANSETRON HCL 4 MG PO TABS: 4.0000 mg | ORAL_TABLET | Freq: Three times a day (TID) | ORAL | 0 refills | Status: DC | PRN

## 2020-01-10 NOTE — Progress Notes (Signed)
We are sorry that you are not feeling well. Here is how we plan to help!  If your abdominal pain worsen, you need to be seen face to face.   Based on what you have shared with me it looks like you have a Virus that is irritating your GI tract.  Vomiting is the forceful emptying of a portion of the stomach's content through the mouth.  Although nausea and vomiting can make you feel miserable, it's important to remember that these are not diseases, but rather symptoms of an underlying illness.  When we treat short term symptoms, we always caution that any symptoms that persist should be fully evaluated in a medical office.  I have prescribed a medication that will help alleviate your symptoms and allow you to stay hydrated:  Zofran 4 mg 1 tablet every 8 hours as needed for nausea and vomiting.  HOME CARE:  Drink clear liquids.  This is very important! Dehydration (the lack of fluid) can lead to a serious complication.  Start off with 1 tablespoon every 5 minutes for 8 hours.  You may begin eating bland foods after 8 hours without vomiting.  Start with saltine crackers, white bread, rice, mashed potatoes, applesauce.  After 48 hours on a bland diet, you may resume a normal diet.  Try to go to sleep.  Sleep often empties the stomach and relieves the need to vomit.  GET HELP RIGHT AWAY IF:   Your symptoms do not improve or worsen within 2 days after treatment.  You have a fever for over 3 days.  You cannot keep down fluids after trying the medication.  MAKE SURE YOU:   Understand these instructions.  Will watch your condition.  Will get help right away if you are not doing well or get worse.   Thank you for choosing an e-visit. Your e-visit answers were reviewed by a board certified advanced clinical practitioner to complete your personal care plan. Depending upon the condition, your plan could have included both over the counter or prescription medications. Please review your  pharmacy choice. Be sure that the pharmacy you have chosen is open so that you can pick up your prescription now.  If there is a problem you may message your provider in MyChart to have the prescription routed to another pharmacy. Your safety is important to [place]. If you have drug allergies check your prescription carefully.  For the next 24 hours, you can use MyChart to ask questions about today's visit, request a non-urgent call back, or ask for a work or school excuse from your e-visit provider. You will get an e-mail in the next two days asking about your experience. I hope that your e-visit has been valuable and will speed your recovery.   Approximately 5 minutes was spent documenting and reviewing patient's chart.

## 2020-02-01 ENCOUNTER — Encounter: Payer: Self-pay | Admitting: Neurology

## 2020-02-01 ENCOUNTER — Telehealth: Payer: Self-pay

## 2020-02-01 ENCOUNTER — Institutional Professional Consult (permissible substitution): Payer: Medicaid Other | Admitting: Neurology

## 2020-02-01 NOTE — Telephone Encounter (Signed)
Pt did not show for their appt with Dr. Athar today.  

## 2020-03-30 ENCOUNTER — Ambulatory Visit: Payer: Medicaid Other | Admitting: Family Medicine

## 2020-04-04 ENCOUNTER — Other Ambulatory Visit: Payer: Self-pay | Admitting: Family Medicine

## 2020-04-04 DIAGNOSIS — F32A Depression, unspecified: Secondary | ICD-10-CM

## 2020-04-04 DIAGNOSIS — F419 Anxiety disorder, unspecified: Secondary | ICD-10-CM

## 2020-05-18 ENCOUNTER — Telehealth (INDEPENDENT_AMBULATORY_CARE_PROVIDER_SITE_OTHER): Payer: Medicaid Other | Admitting: Medical

## 2020-05-18 ENCOUNTER — Encounter: Payer: Self-pay | Admitting: Medical

## 2020-05-18 ENCOUNTER — Other Ambulatory Visit: Payer: Self-pay | Admitting: Medical

## 2020-05-18 ENCOUNTER — Other Ambulatory Visit: Payer: Self-pay

## 2020-05-18 VITALS — Temp 98.7°F

## 2020-05-18 DIAGNOSIS — R112 Nausea with vomiting, unspecified: Secondary | ICD-10-CM

## 2020-05-18 DIAGNOSIS — R195 Other fecal abnormalities: Secondary | ICD-10-CM | POA: Diagnosis not present

## 2020-05-18 MED ORDER — ONDANSETRON 4 MG PO TBDP: 4.0000 mg | ORAL_TABLET | Freq: Three times a day (TID) | ORAL | 0 refills | Status: DC | PRN

## 2020-05-18 NOTE — Patient Instructions (Addendum)
Probable viral gastroenteritis.  Early in illness presentation.  I recommend bland diet guidelines as discussed, hydration with [employer] water or sugar-free Gatorade and Zofran for nausea or vomiting.  Explained if viral illness symptoms may resolve on relatively quickly in 2 to 3 days.  Explained can use Imodium A-D over-the-counter.  However tomorrow afternoon recommend holding Imodium A-D to see if loose stool/diarrhea present.  If so then recommend collecting stool panel studies and returning those prior to weekend.  Stool study order placed today and she can pick up the kit today when she picks up prescription of Zofran.  Note patient elected to schedule virtual visit for convenience.  Overall clinical presentation I do not think matches with Covid.  Low infection rates likely as well.  Follow-up in 7 days or as needed.
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
## 2020-05-20 ENCOUNTER — Encounter: Payer: Self-pay | Admitting: Medical

## 2020-05-20 ENCOUNTER — Telehealth: Payer: Self-pay | Admitting: Medical

## 2020-05-20 ENCOUNTER — Encounter (HOSPITAL_BASED_OUTPATIENT_CLINIC_OR_DEPARTMENT_OTHER): Payer: Self-pay | Admitting: Emergency Medicine

## 2020-05-20 ENCOUNTER — Other Ambulatory Visit: Payer: Self-pay

## 2020-05-20 ENCOUNTER — Emergency Department (HOSPITAL_BASED_OUTPATIENT_CLINIC_OR_DEPARTMENT_OTHER)
Admission: EM | Admit: 2020-05-20 | Discharge: 2020-05-20 | Disposition: A | Discharge status: Home / Self Care | Payer: Medicaid Other | Attending: Emergency Medicine | Admitting: Emergency Medicine

## 2020-05-20 DIAGNOSIS — A084 Viral intestinal infection, unspecified: Secondary | ICD-10-CM | POA: Diagnosis not present

## 2020-05-20 DIAGNOSIS — R112 Nausea with vomiting, unspecified: Secondary | ICD-10-CM | POA: Diagnosis present

## 2020-05-20 DIAGNOSIS — E860 Dehydration: Secondary | ICD-10-CM | POA: Diagnosis not present

## 2020-05-20 LAB — CBC WITH DIFFERENTIAL/PLATELET
Abs Immature Granulocytes: 0.02 10*3/uL (ref 0.00–0.07)
Basophils Absolute: 0 10*3/uL (ref 0.0–0.1)
Basophils Relative: 0 %
Eosinophils Absolute: 0 10*3/uL (ref 0.0–0.5)
Eosinophils Relative: 1 %
HCT: 34.9 % — ABNORMAL LOW (ref 36.0–46.0)
Hemoglobin: 11.3 g/dL — ABNORMAL LOW (ref 12.0–15.0)
Immature Granulocytes: 1 %
Lymphocytes Relative: 9 %
Lymphs Abs: 0.4 10*3/uL — ABNORMAL LOW (ref 0.7–4.0)
MCH: 24.8 pg — ABNORMAL LOW (ref 26.0–34.0)
MCHC: 32.4 g/dL (ref 30.0–36.0)
MCV: 76.5 fL — ABNORMAL LOW (ref 80.0–100.0)
Monocytes Absolute: 0.4 10*3/uL (ref 0.1–1.0)
Monocytes Relative: 10 %
Neutro Abs: 3.5 10*3/uL (ref 1.7–7.7)
Neutrophils Relative %: 79 %
Platelets: 214 10*3/uL (ref 150–400)
RBC: 4.56 MIL/uL (ref 3.87–5.11)
RDW: 18.4 % — ABNORMAL HIGH (ref 11.5–15.5)
WBC: 4.4 10*3/uL (ref 4.0–10.5)
nRBC: 0 % (ref 0.0–0.2)

## 2020-05-20 LAB — BASIC METABOLIC PANEL
Anion gap: 10 (ref 5–15)
BUN: 9 mg/dL (ref 6–20)
CO2: 21 mmol/L — ABNORMAL LOW (ref 22–32)
Calcium: 9.1 mg/dL (ref 8.9–10.3)
Chloride: 104 mmol/L (ref 98–111)
Creatinine, Ser: 0.58 mg/dL (ref 0.44–1.00)
GFR, Estimated: >60 mL/min (ref >60)
Glucose, Bld: 122 mg/dL — ABNORMAL HIGH (ref 70–99)
Potassium: 3.5 mmol/L (ref 3.5–5.1)
Sodium: 135 mmol/L (ref 135–145)

## 2020-05-20 LAB — URINALYSIS, ROUTINE W REFLEX MICROSCOPIC
Glucose, UA: NEGATIVE mg/dL
Ketones, ur: 40 mg/dL — AB
Leukocytes,Ua: NEGATIVE
Nitrite: NEGATIVE
Protein, ur: >300 mg/dL — AB
Specific Gravity, Urine: >1.03 — ABNORMAL HIGH (ref 1.005–1.030)
pH: 6 (ref 5.0–8.0)

## 2020-05-20 LAB — GASTROINTESTINAL PANEL BY PCR, STOOL (REPLACES STOOL CULTURE)

## 2020-05-20 LAB — URINALYSIS, MICROSCOPIC (REFLEX)

## 2020-05-20 LAB — PREGNANCY, URINE: Preg Test, Ur: NEGATIVE

## 2020-05-20 MED ORDER — ONDANSETRON HCL 4 MG/2ML IJ SOLN
4.0000 mg | Freq: Once | INTRAMUSCULAR | Status: AC
  Administered 2020-05-20: 4 mg via INTRAVENOUS
  Filled 2020-05-20: qty 2

## 2020-05-20 MED ORDER — ONDANSETRON 8 MG PO TBDP: 8.0000 mg | ORAL_TABLET | Freq: Three times a day (TID) | ORAL | 0 refills | Status: DC | PRN

## 2020-05-20 MED ORDER — LACTATED RINGERS IV BOLUS
1000.0000 mL | Freq: Once | INTRAVENOUS | Status: AC
  Administered 2020-05-20: 1000 mL via INTRAVENOUS

## 2020-05-20 MED ORDER — DIPHENOXYLATE-ATROPINE 2.5-0.025 MG PO TABS
1.0000 | ORAL_TABLET | Freq: Four times a day (QID) | ORAL | 0 refills | Status: DC | PRN
  Filled 2020-05-21: qty 8, 2d supply, fill #0

## 2020-05-20 NOTE — ED Triage Notes (Signed)
neasea and emesis off and on since Monday has developed diarrhea since. Progressively gotten worse since 5pm yesterday.

## 2020-05-20 NOTE — Telephone Encounter (Signed)
Rx lomotil sent to pharmacy. MA took rx downstairs.
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
## 2020-05-21 ENCOUNTER — Other Ambulatory Visit (HOSPITAL_BASED_OUTPATIENT_CLINIC_OR_DEPARTMENT_OTHER): Payer: Self-pay

## 2020-05-21 MED FILL — Ondansetron Orally Disintegrating Tab 4 MG: ORAL | 7 days supply | Qty: 20 | Fill #0 | Status: AC

## 2020-05-23 ENCOUNTER — Telehealth: Payer: Self-pay | Admitting: *Deleted

## 2020-05-23 ENCOUNTER — Encounter: Payer: Self-pay | Admitting: Family Medicine

## 2020-05-23 NOTE — Telephone Encounter (Signed)
I have already communicated with pt today and she is feeling much better

## 2020-05-23 NOTE — Telephone Encounter (Signed)
Call Type Triage / Clinical Relationship To Patient Self Return Phone Number [phone] (Primary) Chief Complaint Abdominal Pain Reason for Call Symptomatic / Request for Health Information Initial Comment Caller states has a stomach bug for the last week. Has diarrhea every time she moves. Has urinated in the last 8 hours. Has moderate abdominal pain. Translation No Nurse Assessment Nurse: Leveda Anna, RN, Caryn Date/Time (Eastern Time): 05/22/2020 10:10:40 AM Confirm and document reason for call. If symptomatic, describe symptoms. ---Caller states she had a stomach bug Monday night/ Tuesday with pain under her ribs and started throwing up with diarrhea. Stopped for a day and came back and went to ED Friday night. Now having diarrhea every time she moves. Been Imodium and not working. Denies fever, has voided. Vomiting resolved. Abd pain under her ribs intermittently   Disp. Time Lamount Cohen Time) Disposition Final User 05/22/2020 10:16:56 AM See PCP within 24 Hours Yes Leveda Anna, RN, [employer]

## 2020-05-23 NOTE — Telephone Encounter (Signed)
Looks like patient went to ED on 05/20/20 and then emailed Ramon Dredge.  Would you like for [place] to get her in again.

## 2020-06-02 ENCOUNTER — Other Ambulatory Visit: Payer: Self-pay | Admitting: Family Medicine

## 2020-06-02 ENCOUNTER — Other Ambulatory Visit (HOSPITAL_BASED_OUTPATIENT_CLINIC_OR_DEPARTMENT_OTHER): Payer: Self-pay

## 2020-06-02 MED ORDER — PROAIR HFA 108 (90 BASE) MCG/ACT IN AERS
INHALATION_SPRAY | RESPIRATORY_TRACT | 6 refills | Status: DC
  Filled 2020-06-02: qty 8.5, 25d supply, fill #0
  Filled 2020-07-22: qty 8.5, 25d supply, fill #1
  Filled 2020-08-29: qty 8.5, 25d supply, fill #2
  Filled 2020-09-22: qty 8.5, 25d supply, fill #3
  Filled 2020-10-14: qty 8.5, 25d supply, fill #4
  Filled 2020-11-23: qty 8.5, 25d supply, fill #5

## 2020-06-02 MED FILL — Clonazepam Tab 0.5 MG: ORAL | 30 days supply | Qty: 60 | Fill #0 | Status: AC

## 2020-07-17 MED FILL — Sertraline HCl Tab 100 MG: ORAL | 90 days supply | Qty: 180 | Fill #0 | Status: AC

## 2020-07-19 ENCOUNTER — Other Ambulatory Visit (HOSPITAL_BASED_OUTPATIENT_CLINIC_OR_DEPARTMENT_OTHER): Payer: Self-pay

## 2020-07-22 ENCOUNTER — Other Ambulatory Visit (HOSPITAL_BASED_OUTPATIENT_CLINIC_OR_DEPARTMENT_OTHER): Payer: Self-pay

## 2020-07-25 ENCOUNTER — Other Ambulatory Visit (HOSPITAL_BASED_OUTPATIENT_CLINIC_OR_DEPARTMENT_OTHER): Payer: Self-pay

## 2020-08-08 MED FILL — Clonazepam Tab 0.5 MG: ORAL | 30 days supply | Qty: 60 | Fill #1 | Status: AC

## 2020-08-09 ENCOUNTER — Other Ambulatory Visit (HOSPITAL_BASED_OUTPATIENT_CLINIC_OR_DEPARTMENT_OTHER): Payer: Self-pay

## 2020-08-29 ENCOUNTER — Other Ambulatory Visit (HOSPITAL_BASED_OUTPATIENT_CLINIC_OR_DEPARTMENT_OTHER): Payer: Self-pay

## 2020-08-29 ENCOUNTER — Other Ambulatory Visit: Payer: Self-pay | Admitting: Family Medicine

## 2020-08-29 DIAGNOSIS — K219 Gastro-esophageal reflux disease without esophagitis: Secondary | ICD-10-CM

## 2020-08-30 ENCOUNTER — Other Ambulatory Visit (HOSPITAL_BASED_OUTPATIENT_CLINIC_OR_DEPARTMENT_OTHER): Payer: Self-pay

## 2020-08-30 MED ORDER — OMEPRAZOLE 40 MG PO CPDR
DELAYED_RELEASE_CAPSULE | Freq: Every day | ORAL | 1 refills | Status: DC
  Filled 2020-08-30 – 2020-09-09 (×2): qty 90, 90d supply, fill #0
  Filled 2020-12-06: qty 90, 90d supply, fill #1

## 2020-09-08 ENCOUNTER — Other Ambulatory Visit (HOSPITAL_BASED_OUTPATIENT_CLINIC_OR_DEPARTMENT_OTHER): Payer: Self-pay

## 2020-09-09 ENCOUNTER — Other Ambulatory Visit (HOSPITAL_BASED_OUTPATIENT_CLINIC_OR_DEPARTMENT_OTHER): Payer: Self-pay

## 2020-09-09 MED FILL — Clonazepam Tab 0.5 MG: ORAL | 30 days supply | Qty: 60 | Fill #2 | Status: AC

## 2020-09-11 ENCOUNTER — Ambulatory Visit: Payer: Medicaid Other

## 2020-09-15 ENCOUNTER — Ambulatory Visit: Payer: Medicaid Other | Attending: Critical Care Medicine

## 2020-09-15 DIAGNOSIS — Z20822 Contact with and (suspected) exposure to covid-19: Secondary | ICD-10-CM

## 2020-09-16 ENCOUNTER — Encounter: Payer: Self-pay | Admitting: Family Medicine

## 2020-09-16 LAB — SARS-COV-2, NAA 2 DAY TAT

## 2020-09-16 LAB — NOVEL CORONAVIRUS, NAA: SARS-CoV-2, NAA: DETECTED — AB

## 2020-09-22 ENCOUNTER — Other Ambulatory Visit (HOSPITAL_BASED_OUTPATIENT_CLINIC_OR_DEPARTMENT_OTHER): Payer: Self-pay

## 2020-10-14 ENCOUNTER — Other Ambulatory Visit (HOSPITAL_BASED_OUTPATIENT_CLINIC_OR_DEPARTMENT_OTHER): Payer: Self-pay

## 2020-10-23 MED FILL — Sertraline HCl Tab 100 MG: ORAL | 90 days supply | Qty: 180 | Fill #1 | Status: AC

## 2020-10-25 ENCOUNTER — Other Ambulatory Visit (HOSPITAL_BASED_OUTPATIENT_CLINIC_OR_DEPARTMENT_OTHER): Payer: Self-pay

## 2020-10-29 ENCOUNTER — Other Ambulatory Visit: Payer: Self-pay | Admitting: Family Medicine

## 2020-10-29 DIAGNOSIS — F419 Anxiety disorder, unspecified: Secondary | ICD-10-CM

## 2020-10-29 DIAGNOSIS — F32A Depression, unspecified: Secondary | ICD-10-CM

## 2020-10-31 ENCOUNTER — Other Ambulatory Visit (HOSPITAL_BASED_OUTPATIENT_CLINIC_OR_DEPARTMENT_OTHER): Payer: Self-pay

## 2020-10-31 ENCOUNTER — Encounter: Payer: Self-pay | Admitting: Family Medicine

## 2020-10-31 MED ORDER — CLONAZEPAM 0.5 MG PO TABS
ORAL_TABLET | ORAL | 0 refills | Status: DC
  Filled 2020-10-31: qty 60, 30d supply, fill #0

## 2020-11-02 ENCOUNTER — Telehealth: Payer: Medicaid Other | Admitting: Emergency Medicine

## 2020-11-02 DIAGNOSIS — R197 Diarrhea, unspecified: Secondary | ICD-10-CM

## 2020-11-02 DIAGNOSIS — R112 Nausea with vomiting, unspecified: Secondary | ICD-10-CM

## 2020-11-02 MED ORDER — ONDANSETRON 4 MG PO TBDP: 4.0000 mg | ORAL_TABLET | Freq: Three times a day (TID) | ORAL | 0 refills | Status: DC | PRN

## 2020-11-02 NOTE — Progress Notes (Signed)
E-Visit for Vomiting  We are sorry that you are not feeling well. Here is how we plan to help!  Based on what you have shared with me it looks like you have a Virus that is irritating your GI tract.  Vomiting is the forceful emptying of a portion of the stomach's content through the mouth.  Although nausea and vomiting can make you feel miserable, it's important to remember that these are not diseases, but rather symptoms of an underlying illness.  When we treat short term symptoms, we always caution that any symptoms that persist should be fully evaluated in a medical office.  I have prescribed a medication that will help alleviate your symptoms and allow you to stay hydrated:  Zofran 4 mg 1 tablet every 8 hours as needed for nausea and vomiting  I've sent a WORK EXCUSE to myChart in case you need it.  HOME CARE: Drink clear liquids.  This is very important! Dehydration (the lack of fluid) can lead to a serious complication.  Start off with 1 tablespoon every 5 minutes for 8 hours. You may begin eating bland foods after 8 hours without vomiting.  Start with saltine crackers, white bread, rice, mashed potatoes, applesauce. After 48 hours on a bland diet, you may resume a normal diet. Try to go to sleep.  Sleep often empties the stomach and relieves the need to vomit.  GET HELP RIGHT AWAY IF:  Your symptoms do not improve or worsen within 2 days after treatment. You have a fever for over 3 days. You cannot keep down fluids after trying the medication.  MAKE SURE YOU:  Understand these instructions. Will watch your condition. Will get help right away if you are not doing well or get worse.   Thank you for choosing an e-visit.  Your e-visit answers were reviewed by a board certified advanced clinical practitioner to complete your personal care plan. Depending upon the condition, your plan could have included both over the counter or prescription medications.  Please review your  pharmacy choice. Make sure the pharmacy is open so you can pick up prescription now. If there is a problem, you may contact your provider through [employer] and have the prescription routed to another pharmacy.  Your safety is important to [place]. If you have drug allergies check your prescription carefully.   For the next 24 hours you can use MyChart to ask questions about today's visit, request a non-urgent call back, or ask for a work or school excuse. You will get an email in the next two days asking about your experience. I hope that your e-visit has been valuable and will speed your recovery.  Approximately 5 minutes was used in reviewing the patient's chart, questionnaire, prescribing medications, and documentation.

## 2020-11-03 ENCOUNTER — Encounter: Payer: Self-pay | Admitting: Family Medicine

## 2020-11-07 ENCOUNTER — Encounter: Payer: Self-pay | Admitting: Family Medicine

## 2020-11-07 ENCOUNTER — Ambulatory Visit: Payer: Medicaid Other | Admitting: Family Medicine

## 2020-11-09 ENCOUNTER — Telehealth: Payer: Medicaid Other | Admitting: Family Medicine

## 2020-11-12 ENCOUNTER — Emergency Department (HOSPITAL_BASED_OUTPATIENT_CLINIC_OR_DEPARTMENT_OTHER)
Admission: EM | Admit: 2020-11-12 | Discharge: 2020-11-12 | Disposition: A | Discharge status: Home / Self Care | Payer: Medicaid Other | Attending: Emergency Medicine | Admitting: Emergency Medicine

## 2020-11-12 ENCOUNTER — Encounter (HOSPITAL_BASED_OUTPATIENT_CLINIC_OR_DEPARTMENT_OTHER): Payer: Self-pay | Admitting: Emergency Medicine

## 2020-11-12 ENCOUNTER — Other Ambulatory Visit: Payer: Self-pay

## 2020-11-12 DIAGNOSIS — R1013 Epigastric pain: Secondary | ICD-10-CM

## 2020-11-12 DIAGNOSIS — R197 Diarrhea, unspecified: Secondary | ICD-10-CM | POA: Insufficient documentation

## 2020-11-12 DIAGNOSIS — R0789 Other chest pain: Secondary | ICD-10-CM | POA: Insufficient documentation

## 2020-11-12 DIAGNOSIS — K219 Gastro-esophageal reflux disease without esophagitis: Secondary | ICD-10-CM | POA: Insufficient documentation

## 2020-11-12 DIAGNOSIS — R748 Abnormal levels of other serum enzymes: Secondary | ICD-10-CM

## 2020-11-12 LAB — CBC WITH DIFFERENTIAL/PLATELET
Abs Immature Granulocytes: 0.03 10*3/uL (ref 0.00–0.07)
Basophils Absolute: 0 10*3/uL (ref 0.0–0.1)
Basophils Relative: 0 %
Eosinophils Absolute: 0.1 10*3/uL (ref 0.0–0.5)
Eosinophils Relative: 2 %
HCT: 36.8 % (ref 36.0–46.0)
Hemoglobin: 12.3 g/dL (ref 12.0–15.0)
Immature Granulocytes: 0 %
Lymphocytes Relative: 19 %
Lymphs Abs: 1.6 10*3/uL (ref 0.7–4.0)
MCH: 26.1 pg (ref 26.0–34.0)
MCHC: 33.4 g/dL (ref 30.0–36.0)
MCV: 78 fL — ABNORMAL LOW (ref 80.0–100.0)
Monocytes Absolute: 0.6 10*3/uL (ref 0.1–1.0)
Monocytes Relative: 6 %
Neutro Abs: 6.2 10*3/uL (ref 1.7–7.7)
Neutrophils Relative %: 73 %
Platelets: 239 10*3/uL (ref 150–400)
RBC: 4.72 MIL/uL (ref 3.87–5.11)
RDW: 17.1 % — ABNORMAL HIGH (ref 11.5–15.5)
WBC: 8.5 10*3/uL (ref 4.0–10.5)
nRBC: 0 % (ref 0.0–0.2)

## 2020-11-12 LAB — COMPREHENSIVE METABOLIC PANEL
ALT: 20 U/L (ref 0–44)
AST: 18 U/L (ref 15–41)
Albumin: 3.9 g/dL (ref 3.5–5.0)
Alkaline Phosphatase: 96 U/L (ref 38–126)
Anion gap: 5 (ref 5–15)
BUN: 9 mg/dL (ref 6–20)
CO2: 25 mmol/L (ref 22–32)
Calcium: 9.1 mg/dL (ref 8.9–10.3)
Chloride: 106 mmol/L (ref 98–111)
Creatinine, Ser: 0.63 mg/dL (ref 0.44–1.00)
GFR, Estimated: >60 mL/min (ref >60)
Glucose, Bld: 120 mg/dL — ABNORMAL HIGH (ref 70–99)
Potassium: 4.1 mmol/L (ref 3.5–5.1)
Sodium: 136 mmol/L (ref 135–145)
Total Bilirubin: 0.2 mg/dL — ABNORMAL LOW (ref 0.3–1.2)
Total Protein: 7.5 g/dL (ref 6.5–8.1)

## 2020-11-12 LAB — URINALYSIS, ROUTINE W REFLEX MICROSCOPIC
Bilirubin Urine: NEGATIVE
Glucose, UA: NEGATIVE mg/dL
Ketones, ur: NEGATIVE mg/dL
Leukocytes,Ua: NEGATIVE
Nitrite: NEGATIVE
Protein, ur: >300 mg/dL — AB
Specific Gravity, Urine: 1.02 (ref 1.005–1.030)
pH: 6 (ref 5.0–8.0)

## 2020-11-12 LAB — URINALYSIS, MICROSCOPIC (REFLEX)

## 2020-11-12 LAB — PREGNANCY, URINE: Preg Test, Ur: NEGATIVE

## 2020-11-12 LAB — LIPASE, BLOOD: Lipase: 212 U/L — ABNORMAL HIGH (ref 11–51)

## 2020-11-12 MED ORDER — DIPHENHYDRAMINE HCL 50 MG/ML IJ SOLN
25.0000 mg | Freq: Once | INTRAMUSCULAR | Status: AC
  Administered 2020-11-12: 25 mg via INTRAVENOUS
  Filled 2020-11-12: qty 1

## 2020-11-12 MED ORDER — METOCLOPRAMIDE HCL 5 MG/ML IJ SOLN
10.0000 mg | Freq: Once | INTRAMUSCULAR | Status: AC
  Administered 2020-11-12: 10 mg via INTRAVENOUS
  Filled 2020-11-12: qty 2

## 2020-11-12 MED ORDER — PANTOPRAZOLE SODIUM 40 MG IV SOLR
40.0000 mg | Freq: Once | INTRAVENOUS | Status: AC
  Administered 2020-11-12: 40 mg via INTRAVENOUS
  Filled 2020-11-12: qty 40
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
## 2020-11-12 NOTE — ED Notes (Signed)
ED Provider at bedside. 

## 2020-11-12 NOTE — Discharge Instructions (Addendum)
1.  You are leaving before completing evaluation due to emergency at home.  You must return immediately if you have worsening or changing symptoms. 2.  Your labs are showing some possible early pancreatitis.  Review this in your instructions.  Avoid all fats.  Return to the emergency department if you develop vomiting worsening pain or other concerning symptoms.  See your doctor on Monday for recheck.

## 2020-11-12 NOTE — ED Triage Notes (Signed)
Upper abdominal pain this am.  Pt states similar episode this week and in the past but never this bad.  No fever. Some diarrhea.  Sometimes pain radiates to the back.

## 2020-11-12 NOTE — ED Notes (Signed)
Pt. States she needs to leave immediately.  Her husband called and their 29 y/o daughter fell and is crying and inconsolable wanting her mother.   Dr. Donnald Garre talked with pt. And explained abnormal labs and recommended she follow up asap since she is leaving prior to completion of work up.

## 2020-11-14 ENCOUNTER — Encounter: Payer: Self-pay | Admitting: Family Medicine
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
## 2020-11-16 ENCOUNTER — Ambulatory Visit (INDEPENDENT_AMBULATORY_CARE_PROVIDER_SITE_OTHER): Payer: Medicaid Other | Admitting: Family Medicine

## 2020-11-16 ENCOUNTER — Other Ambulatory Visit: Payer: Self-pay

## 2020-11-16 ENCOUNTER — Encounter: Payer: Self-pay | Admitting: Family Medicine

## 2020-11-16 VITALS — BP 122/80 | HR 81 | Temp 98.2°F | Resp 18 | Ht 65.0 in | Wt 280.8 lb

## 2020-11-16 DIAGNOSIS — K8500 Idiopathic acute pancreatitis without necrosis or infection: Secondary | ICD-10-CM | POA: Diagnosis not present

## 2020-11-16 DIAGNOSIS — F439 Reaction to severe stress, unspecified: Secondary | ICD-10-CM | POA: Diagnosis not present

## 2020-11-16 DIAGNOSIS — R635 Abnormal weight gain: Secondary | ICD-10-CM

## 2020-11-16 LAB — LIPASE: Lipase: 46 U/L (ref 11.0–59.0)

## 2020-11-16 LAB — TSH: TSH: 1.15 u[IU]/mL (ref 0.35–5.50)

## 2020-11-16 NOTE — Patient Instructions (Signed)
Good to see you again today!  I will be in touch with your lipase and your thyroid level  We will place a referral for you to be seen by General surgery to discuss weight loss surgery- prior to being seen you need to complete the educational seminar- in person or on line  [email or web address]  Please let me know if your pain is getting worse or coming back

## 2020-11-23 ENCOUNTER — Other Ambulatory Visit (HOSPITAL_BASED_OUTPATIENT_CLINIC_OR_DEPARTMENT_OTHER): Payer: Self-pay

## 2020-11-23 ENCOUNTER — Other Ambulatory Visit: Payer: Self-pay | Admitting: Family Medicine

## 2020-11-23 MED ORDER — ALBUTEROL SULFATE HFA 108 (90 BASE) MCG/ACT IN AERS
1.0000 | INHALATION_SPRAY | Freq: Four times a day (QID) | RESPIRATORY_TRACT | 99 refills | Status: DC | PRN
  Filled 2020-11-23: qty 18, 25d supply, fill #0
  Filled 2021-01-06: qty 18, 25d supply, fill #1
  Filled 2021-02-03: qty 18, 25d supply, fill #2
  Filled 2021-03-08: qty 18, 25d supply, fill #3
  Filled 2021-04-05: qty 18, 25d supply, fill #4
  Filled 2021-05-16: qty 18, 25d supply, fill #5
  Filled 2021-07-10: qty 18, 25d supply, fill #6
  Filled 2021-08-16: qty 18, 25d supply, fill #7
  Filled 2021-10-10: qty 18, 25d supply, fill #8
  Filled 2021-11-13: qty 18, 25d supply, fill #9

## 2020-11-23 MED ORDER — ALBUTEROL SULFATE HFA 108 (90 BASE) MCG/ACT IN AERS
INHALATION_SPRAY | RESPIRATORY_TRACT | 0 refills | Status: DC
  Filled 2020-11-23: qty 8.5, 30d supply, fill #0

## 2020-11-24 ENCOUNTER — Other Ambulatory Visit (HOSPITAL_BASED_OUTPATIENT_CLINIC_OR_DEPARTMENT_OTHER): Payer: Self-pay

## 2020-12-06 ENCOUNTER — Other Ambulatory Visit (HOSPITAL_BASED_OUTPATIENT_CLINIC_OR_DEPARTMENT_OTHER): Payer: Self-pay

## 2020-12-06 ENCOUNTER — Other Ambulatory Visit: Payer: Self-pay | Admitting: Family Medicine

## 2020-12-06 DIAGNOSIS — F32A Depression, unspecified: Secondary | ICD-10-CM

## 2020-12-06 MED ORDER — CLONAZEPAM 0.5 MG PO TABS
ORAL_TABLET | ORAL | 3 refills | Status: DC
  Filled 2020-12-06: qty 60, 30d supply, fill #0
  Filled 2021-02-03: qty 60, 30d supply, fill #1
  Filled 2021-03-01 – 2021-04-21 (×2): qty 60, 30d supply, fill #2
  Filled 2021-05-16 – 2021-05-19 (×2): qty 60, 30d supply, fill #3

## 2020-12-24 ENCOUNTER — Emergency Department: Admit: 2020-12-24 | Payer: Self-pay

## 2020-12-24 ENCOUNTER — Telehealth: Payer: Medicaid Other | Admitting: Nurse Practitioner

## 2020-12-24 DIAGNOSIS — J029 Acute pharyngitis, unspecified: Secondary | ICD-10-CM

## 2020-12-24 DIAGNOSIS — M791 Myalgia, unspecified site: Secondary | ICD-10-CM

## 2020-12-24 MED ORDER — CEFDINIR 300 MG PO CAPS: 300.0000 mg | ORAL_CAPSULE | Freq: Two times a day (BID) | ORAL | 0 refills | Status: DC

## 2020-12-24 NOTE — Progress Notes (Signed)
Virtual Visit Consent   Sandy Perez, you are scheduled for a virtual visit with Mary-Margaret Daphine Deutscher, FNP, a [facility] provider, today.     Just as with appointments in the office, your consent must be obtained to participate.  Your consent will be active for this visit and any virtual visit you may have with one of our providers in the next 365 days.     If you have a MyChart account, a copy of this consent can be sent to you electronically.  All virtual visits are billed to your insurance company just like a traditional visit in the office.    As this is a virtual visit, video technology does not allow for your provider to perform a traditional examination.  This may limit your provider's ability to fully assess your condition.  If your provider identifies any concerns that need to be evaluated in person or the need to arrange testing (such as labs, EKG, etc.), we will make arrangements to do so.     Although advances in technology are sophisticated, we cannot ensure that it will always work on either your end or our end.  If the connection with a video visit is poor, the visit may have to be switched to a telephone visit.  With either a video or telephone visit, we are not always able to ensure that we have a secure connection.     I need to obtain your verbal consent now.   Are you willing to proceed with your visit today? YES   Sandy Perez has provided verbal consent on 12/24/2020 for a virtual visit (video or telephone).   Mary-Margaret Daphine Deutscher, FNP   Date: 12/24/2020 10:54 AM   Virtual Visit via Video Note   I, Mary-Margaret Daphine Deutscher, connected with Sandy Perez ([id number], May 01, 1990) on 12/24/20 at 11:00 AM EDT by a video-enabled telemedicine application and verified that I am speaking with the correct person using two identifiers.  Location: Patient: Virtual Visit Location Patient: Home Provider: Virtual Visit Location Provider: Mobile   I discussed the limitations of  evaluation and management by telemedicine and the availability of in person appointments. The patient expressed understanding and agreed to proceed.    History of Present Illness: Sandy Perez is a 30 y.o. who identifies as a female who was assigned female at birth, and is being seen today for sore throat.  HPI: Patient says she started with sore throat and  body aches yesterday. Said she slept all day yasterday. This morning she still has sore throat, body aches and now is having right ear pain. She has not done coivd test.   Problems:  Patient Active Problem List   Diagnosis Date Noted   Vitamin D deficiency 02/23/2019   Pre-diabetes 02/23/2019   Anemia 12/14/2017   Preeclampsia 06/03/2014   Amenorrhea 07/28/2012   History of PCOS 07/28/2012   Acute pharyngitis 05/22/2012   Screening for malignant neoplasm of the cervix 12/18/2011   Migraines 11/01/2011   Anxiety and depression 11/01/2011   GERD (gastroesophageal reflux disease) 11/01/2011    Allergies:  Allergies  Allergen Reactions   Aripiprazole Other (See Comments)    States makes her suicidal   Imitrex  [Sumatriptan]     Chest tightness on 01/31/09   Wellbutrin [Bupropion]     Suicidal thoughts   Medications:  Current Outpatient Medications:    albuterol (VENTOLIN HFA) 108 (90 Base) MCG/ACT inhaler, Inhale 1-2 puffs by mouth into the lungs every 6 (  six) hours as needed for wheezing or shortness of breath., Disp: 18 g, Rfl: PRN   Cholecalciferol (VITAMIN D3) 1.25 MG (50000 UT) CAPS, Take 1 weekly for 12 weeks, Disp: 12 capsule, Rfl: 0   clonazePAM (KLONOPIN) 0.5 MG tablet, TAKE 1/2 - 1 TABLET BY MOUTH TWO TIMES DAILY AS NEEDED FOR ANXIETY, Disp: 60 tablet, Rfl: 3   diphenoxylate-atropine (LOMOTIL) 2.5-0.025 MG tablet, Take 1 tablet by mouth 4 (four) times daily as needed for diarrhea or loose stools., Disp: 8 tablet, Rfl: 0   omeprazole (PRILOSEC) 40 MG capsule, TAKE 1 CAPSULE BY MOUTH ONCE DAILY, Disp: 90 capsule, Rfl:  1   ondansetron (ZOFRAN ODT) 4 MG disintegrating tablet, Take 1 tablet (4 mg total) by mouth every 8 (eight) hours as needed for nausea or vomiting., Disp: 10 tablet, Rfl: 0   sertraline (ZOLOFT) 100 MG tablet, TAKE 2 TABLETS (200 MG TOTAL) BY MOUTH DAILY., Disp: 180 tablet, Rfl: 3  Observations/Objective: Patient is well-developed, well-nourished in no acute distress.  Resting comfortably  at home.  Head is normocephalic, atraumatic.  No labored breathing.  Speech is clear and coherent with logical content.  Patient is alert and oriented at baseline.    Assessment and Plan:  Sandy Perez in today with chief complaint of No chief complaint on file.   1. Pharyngitis, unspecified etiology  2. Myalgia  1. Take meds as prescribed 2. Use a cool mist humidifier especially during the winter months and when heat has been humid. 3. Use saline nose sprays frequently 4. Saline irrigations of the nose can be very helpful if done frequently.  * 4X daily for 1 week*  * Use of a nettie pot can be helpful with this. Follow directions with this* 5. Drink plenty of fluids 6. Keep thermostat turn down low 7.For any cough or congestion  Use plain Mucinex- regular strength or max strength is fine   * Children- consult with Pharmacist for dosing 8. For fever or aces or pains- take tylenol or ibuprofen appropriate for age and weight.  * for fevers greater than 101 orally you may alternate ibuprofen and tylenol every  3 hours.   Patient had home covid test- she will teat and I will touch base with her in about 45 minutes for results   Addendum- covid test was negative.  Meds ordered this encounter  Medications   cefdinir (OMNICEF) 300 MG capsule    Sig: Take 1 capsule (300 mg total) by mouth 2 (two) times daily. 1 po BID    Dispense:  20 capsule    Refill:  0    Order Specific Question:   Supervising Provider    Answer:   Eber Hong [[id number]]     Follow Up Instructions: I discussed the  assessment and treatment plan with the patient. The patient was provided an opportunity to ask questions and all were answered. The patient agreed with the plan and demonstrated an understanding of the instructions.  A copy of instructions were sent to the patient via MyChart.  The patient was advised to call back or seek an in-person evaluation if the symptoms worsen or if the condition fails to improve as anticipated.  Time:  I spent 15 minutes with the patient via telehealth technology discussing the above problems/concerns.    Mary-Margaret Daphine Deutscher, FNP

## 2020-12-24 NOTE — Patient Instructions (Signed)
Pharyngitis °Pharyngitis is inflammation of the throat (pharynx). It is a very common cause of sore throat. Pharyngitis can be caused by a bacteria, but it is usually caused by a virus. Most cases of pharyngitis get better on their own without treatment. °What are the causes? °This condition may be caused by: °Infection by viruses (viral). Viral pharyngitis spreads easily from person to person (is contagious) through coughing, sneezing, and sharing of personal items or utensils such as cups, forks, spoons, and toothbrushes. °Infection by bacteria (bacterial). Bacterial pharyngitis may be spread by touching the nose or face after coming in contact with the bacteria, or through close contact, such as kissing. °Allergies. Allergies can cause buildup of mucus in the throat (post-nasal drip), leading to inflammation and irritation. Allergies can also cause blocked nasal passages, forcing breathing through the mouth, which dries and irritates the throat. °What increases the risk? °You are more likely to develop this condition if: °You are 5-24 years old. °You are exposed to crowded environments such as daycare, school, or dormitory living. °You live in a cold climate. °You have a weakened disease-fighting (immune) system. °What are the signs or symptoms? °Symptoms of this condition vary by the cause. Common symptoms of this condition include: °Sore throat. °Fatigue. °Low-grade fever. °Stuffy nose (nasal congestion) and cough. °Headache. °Other symptoms may include: °Glands in the neck (lymph nodes) that are swollen. °Skin rashes. °Plaque-like film on the throat or tonsils. This is often a symptom of bacterial pharyngitis. °Vomiting. °Red, itchy eyes (conjunctivitis). °Loss of appetite. °Joint pain and muscle aches. °Enlarged tonsils. °How is this diagnosed? °This condition may be diagnosed based on your medical history and a physical exam. Your health care provider will ask you questions about your illness and your  symptoms. °A swab of your throat may be done to check for bacteria (rapid strep test). Other lab tests may also be done, depending on the suspected cause, but these are rare. °How is this treated? °Many times, treatment is not needed for this condition. Pharyngitis usually gets better in 3-4 days without treatment. °Bacterial pharyngitis may be treated with antibiotic medicines. °Follow these instructions at home: °Medicines °Take over-the-counter and prescription medicines only as told by your health care provider. °If you were prescribed an antibiotic medicine, take it as told by your health care provider. Do not stop taking the antibiotic even if you start to feel better. °Use throat sprays to soothe your throat as told by your health care provider. °Children can get pharyngitis. Do not give your child aspirin because of the association with Reye's syndrome. °Managing pain °To help with pain, try: °Sipping warm liquids, such as broth, herbal tea, or warm water. °Eating or drinking cold or frozen liquids, such as frozen ice pops. °Gargling with a mixture of salt and water 3-4 times a day or as needed. To make salt water, completely dissolve ½-1 tsp (3-6 g) of salt in 1 cup (237 mL) of warm water. °Sucking on hard candy or throat lozenges. °Putting a cool-mist humidifier in your bedroom at night to moisten the air. °Sitting in the bathroom with the door closed for 5-10 minutes while you run hot water in the shower. ° °General instructions ° °Do not use any products that contain nicotine or tobacco. These products include cigarettes, chewing tobacco, and vaping devices, such as e-cigarettes. If you need help quitting, ask your health care provider. °Rest as told by your health care provider. °Drink enough fluid to keep your urine pale yellow. °How   is this prevented? °To help prevent becoming infected or spreading infection: °Wash your hands often with soap and water for at least 20 seconds. If soap and water are not  available, use hand sanitizer. °Do not touch your eyes, nose, or mouth with unwashed hands, and wash hands after touching these areas. °Do not share cups or eating utensils. °Avoid close contact with people who are sick. °Contact a health care provider if: °You have large, tender lumps in your neck. °You have a rash. °You cough up green, yellow-brown, or bloody mucus. °Get help right away if: °Your neck becomes stiff. °You drool or are unable to swallow liquids. °You cannot drink or take medicines without vomiting. °You have severe pain that does not go away, even after you take medicine. °You have trouble breathing, and it is not caused by a stuffy nose. °You have new pain and swelling in your joints such as the knees, ankles, wrists, or elbows. °These symptoms may represent a serious problem that is an emergency. Do not wait to see if the symptoms will go away. Get medical help right away. Call your local emergency services (911 in the [place] Do not drive yourself to the hospital. °Summary °Pharyngitis is redness, pain, and swelling (inflammation) of the throat (pharynx). °While pharyngitis can be caused by a bacteria, the most common causes are viral. °Most cases of pharyngitis get better on their own without treatment. °Bacterial pharyngitis is treated with antibiotic medicines. °This information is not intended to replace advice given to you by your health care provider. Make sure you discuss any questions you have with your health care provider. °Document Revised: 05/04/2020 Document Reviewed: 05/04/2020 °Elsevier Patient Education © 2022 [employer] ° °

## 2020-12-25 ENCOUNTER — Ambulatory Visit: Payer: Medicaid Other

## 2020-12-29 ENCOUNTER — Encounter: Payer: Self-pay | Admitting: Family Medicine

## 2021-01-06 ENCOUNTER — Other Ambulatory Visit (HOSPITAL_BASED_OUTPATIENT_CLINIC_OR_DEPARTMENT_OTHER): Payer: Self-pay

## 2021-02-03 ENCOUNTER — Other Ambulatory Visit (HOSPITAL_BASED_OUTPATIENT_CLINIC_OR_DEPARTMENT_OTHER): Payer: Self-pay

## 2021-02-09 ENCOUNTER — Other Ambulatory Visit (HOSPITAL_BASED_OUTPATIENT_CLINIC_OR_DEPARTMENT_OTHER): Payer: Self-pay

## 2021-02-09 ENCOUNTER — Other Ambulatory Visit: Payer: Self-pay | Admitting: Family Medicine

## 2021-02-09 DIAGNOSIS — F4323 Adjustment disorder with mixed anxiety and depressed mood: Secondary | ICD-10-CM

## 2021-02-09 MED ORDER — SERTRALINE HCL 100 MG PO TABS
200.0000 mg | ORAL_TABLET | Freq: Every day | ORAL | 3 refills | Status: DC
  Filled 2021-02-09 – 2021-03-01 (×2): qty 180, 90d supply, fill #0
  Filled 2021-06-12: qty 180, 90d supply, fill #1
  Filled 2021-07-06: qty 174, 87d supply, fill #1
  Filled 2021-07-06: qty 6, 3d supply, fill #1
  Filled 2021-10-10: qty 180, 90d supply, fill #2
  Filled 2022-01-23: qty 180, 90d supply, fill #3

## 2021-02-10 ENCOUNTER — Other Ambulatory Visit (HOSPITAL_BASED_OUTPATIENT_CLINIC_OR_DEPARTMENT_OTHER): Payer: Self-pay

## 2021-02-20 ENCOUNTER — Other Ambulatory Visit (HOSPITAL_BASED_OUTPATIENT_CLINIC_OR_DEPARTMENT_OTHER): Payer: Self-pay

## 2021-02-26 ENCOUNTER — Encounter: Payer: Self-pay | Admitting: Family Medicine

## 2021-02-28 ENCOUNTER — Emergency Department (INDEPENDENT_AMBULATORY_CARE_PROVIDER_SITE_OTHER)
Admission: RE | Admit: 2021-02-28 | Discharge: 2021-02-28 | Disposition: A | Discharge status: Home / Self Care | Payer: Medicaid Other | Source: Ambulatory Visit

## 2021-02-28 ENCOUNTER — Other Ambulatory Visit: Payer: Self-pay

## 2021-02-28 VITALS — BP 122/84 | HR 98 | Temp 98.7°F | Resp 18

## 2021-02-28 DIAGNOSIS — J3489 Other specified disorders of nose and nasal sinuses: Secondary | ICD-10-CM

## 2021-02-28 DIAGNOSIS — R059 Cough, unspecified: Secondary | ICD-10-CM

## 2021-02-28 DIAGNOSIS — J0100 Acute maxillary sinusitis, unspecified: Secondary | ICD-10-CM | POA: Diagnosis not present

## 2021-02-28 MED ORDER — PREDNISONE 20 MG PO TABS
60.0000 mg | ORAL_TABLET | Freq: Every day | ORAL | 0 refills | Status: DC
  Filled 2021-03-01: qty 15, 5d supply, fill #0

## 2021-02-28 MED ORDER — AMOXICILLIN-POT CLAVULANATE 875-125 MG PO TABS
1.0000 | ORAL_TABLET | Freq: Two times a day (BID) | ORAL | 0 refills | Status: DC
  Filled 2021-03-01: qty 14, 7d supply, fill #0

## 2021-02-28 MED ORDER — BENZONATATE 200 MG PO CAPS
200.0000 mg | ORAL_CAPSULE | Freq: Three times a day (TID) | ORAL | 0 refills | Status: AC | PRN
  Filled 2021-03-01: qty 40, 14d supply, fill #0

## 2021-02-28 NOTE — Discharge Instructions (Addendum)
Advised patient to take medication as directed with food to completion.  Advised patient to take prednisone with first dose of Augmentin for the next 5 of 7 days.  Advised may use Tessalon Perles daily or as needed for cough.  Encouraged patient to increase daily water intake while taking these medications.
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
## 2021-02-28 NOTE — ED Triage Notes (Signed)
Patient presents to Urgent Care with complaints of possible sinus infection. C/o facial pressure, dry cough x 1 week. She had the flu about 2-3 weeks ago. Treating symptoms with Tylenol.   Denies fever.

## 2021-03-01 ENCOUNTER — Other Ambulatory Visit (HOSPITAL_BASED_OUTPATIENT_CLINIC_OR_DEPARTMENT_OTHER): Payer: Self-pay

## 2021-03-01 ENCOUNTER — Other Ambulatory Visit (HOSPITAL_COMMUNITY): Payer: Self-pay

## 2021-03-03 ENCOUNTER — Other Ambulatory Visit (HOSPITAL_COMMUNITY): Payer: Self-pay

## 2021-03-08 ENCOUNTER — Other Ambulatory Visit (HOSPITAL_BASED_OUTPATIENT_CLINIC_OR_DEPARTMENT_OTHER): Payer: Self-pay

## 2021-03-15 ENCOUNTER — Other Ambulatory Visit (HOSPITAL_COMMUNITY): Payer: Self-pay

## 2021-04-05 ENCOUNTER — Other Ambulatory Visit (HOSPITAL_BASED_OUTPATIENT_CLINIC_OR_DEPARTMENT_OTHER): Payer: Self-pay

## 2021-04-05 ENCOUNTER — Other Ambulatory Visit: Payer: Self-pay | Admitting: Family Medicine

## 2021-04-05 DIAGNOSIS — K219 Gastro-esophageal reflux disease without esophagitis: Secondary | ICD-10-CM

## 2021-04-05 MED ORDER — OMEPRAZOLE 40 MG PO CPDR
DELAYED_RELEASE_CAPSULE | Freq: Every day | ORAL | 1 refills | Status: DC
  Filled 2021-04-05: qty 90, 90d supply, fill #0
  Filled 2021-07-10: qty 90, 90d supply, fill #1

## 2021-04-20 ENCOUNTER — Encounter: Payer: Self-pay | Admitting: Family Medicine

## 2021-04-21 ENCOUNTER — Telehealth: Payer: Self-pay

## 2021-04-21 ENCOUNTER — Other Ambulatory Visit (HOSPITAL_BASED_OUTPATIENT_CLINIC_OR_DEPARTMENT_OTHER): Payer: Self-pay

## 2021-04-21 ENCOUNTER — Ambulatory Visit (INDEPENDENT_AMBULATORY_CARE_PROVIDER_SITE_OTHER): Payer: Medicaid Other | Admitting: Family

## 2021-04-21 VITALS — BP 143/87 | HR 92 | Temp 97.9°F | Resp 16 | Wt 262.0 lb

## 2021-04-21 DIAGNOSIS — F419 Anxiety disorder, unspecified: Secondary | ICD-10-CM

## 2021-04-21 DIAGNOSIS — F32A Depression, unspecified: Secondary | ICD-10-CM

## 2021-04-21 DIAGNOSIS — M546 Pain in thoracic spine: Secondary | ICD-10-CM | POA: Diagnosis not present

## 2021-04-21 DIAGNOSIS — I10 Essential (primary) hypertension: Secondary | ICD-10-CM | POA: Diagnosis not present

## 2021-04-21 MED ORDER — MELOXICAM 7.5 MG PO TABS
7.5000 mg | ORAL_TABLET | Freq: Every day | ORAL | 0 refills | Status: DC
  Filled 2021-04-21: qty 14, 14d supply, fill #0

## 2021-04-21 MED ORDER — METHOCARBAMOL 500 MG PO TABS
500.0000 mg | ORAL_TABLET | Freq: Three times a day (TID) | ORAL | 0 refills | Status: DC | PRN
  Filled 2021-04-21: qty 20, 7d supply, fill #0

## 2021-04-21 NOTE — Assessment & Plan Note (Signed)
Uncontrolled. I recommended that she call to schedule a counseling appointment. She is also advised to follow up with PCP in 1 month.  ?
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
## 2021-04-21 NOTE — Patient Instructions (Addendum)
For back pain, please begin meloxicam (anti-inflammatory) and Robaxin (muscle relaxer) as needed.  ?Please Call [facility] to schedule an appointment for counseling.  ?

## 2021-04-21 NOTE — Assessment & Plan Note (Signed)
Plan low sodium diet. Follow up with PCP in 1 month for recheck.  Likely related to stress/pain.  ? ?BP Readings from Last 3 Encounters:  ?04/21/21 (!) 143/87  ?02/28/21 122/84  ?11/16/20 122/80  ? ? ?
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
## 2021-05-16 ENCOUNTER — Other Ambulatory Visit (HOSPITAL_BASED_OUTPATIENT_CLINIC_OR_DEPARTMENT_OTHER): Payer: Self-pay

## 2021-05-19 ENCOUNTER — Other Ambulatory Visit (HOSPITAL_BASED_OUTPATIENT_CLINIC_OR_DEPARTMENT_OTHER): Payer: Self-pay

## 2021-05-30 ENCOUNTER — Other Ambulatory Visit: Payer: Self-pay

## 2021-05-30 ENCOUNTER — Other Ambulatory Visit (HOSPITAL_BASED_OUTPATIENT_CLINIC_OR_DEPARTMENT_OTHER): Payer: Self-pay

## 2021-05-30 ENCOUNTER — Emergency Department (HOSPITAL_BASED_OUTPATIENT_CLINIC_OR_DEPARTMENT_OTHER)
Admission: EM | Admit: 2021-05-30 | Discharge: 2021-05-30 | Disposition: A | Discharge status: Home / Self Care | Payer: Medicaid Other | Attending: Emergency Medicine | Admitting: Emergency Medicine

## 2021-05-30 ENCOUNTER — Encounter (HOSPITAL_BASED_OUTPATIENT_CLINIC_OR_DEPARTMENT_OTHER): Payer: Self-pay

## 2021-05-30 DIAGNOSIS — R112 Nausea with vomiting, unspecified: Secondary | ICD-10-CM | POA: Insufficient documentation

## 2021-05-30 DIAGNOSIS — R824 Acetonuria: Secondary | ICD-10-CM | POA: Diagnosis not present

## 2021-05-30 DIAGNOSIS — E876 Hypokalemia: Secondary | ICD-10-CM | POA: Diagnosis not present

## 2021-05-30 DIAGNOSIS — R197 Diarrhea, unspecified: Secondary | ICD-10-CM | POA: Insufficient documentation

## 2021-05-30 DIAGNOSIS — R809 Proteinuria, unspecified: Secondary | ICD-10-CM | POA: Insufficient documentation

## 2021-05-30 DIAGNOSIS — R1084 Generalized abdominal pain: Secondary | ICD-10-CM | POA: Diagnosis present

## 2021-05-30 DIAGNOSIS — D649 Anemia, unspecified: Secondary | ICD-10-CM | POA: Diagnosis not present

## 2021-05-30 DIAGNOSIS — K529 Noninfective gastroenteritis and colitis, unspecified: Secondary | ICD-10-CM

## 2021-05-30 LAB — URINALYSIS, ROUTINE W REFLEX MICROSCOPIC
Glucose, UA: NEGATIVE mg/dL
Hgb urine dipstick: NEGATIVE
Ketones, ur: 40 mg/dL — AB
Leukocytes,Ua: NEGATIVE
Nitrite: NEGATIVE
Protein, ur: >=300 mg/dL — AB
Specific Gravity, Urine: >=1.03 (ref 1.005–1.030)
pH: 6 (ref 5.0–8.0)

## 2021-05-30 LAB — COMPREHENSIVE METABOLIC PANEL
ALT: 18 U/L (ref 0–44)
AST: 17 U/L (ref 15–41)
Albumin: 3.8 g/dL (ref 3.5–5.0)
Alkaline Phosphatase: 63 U/L (ref 38–126)
Anion gap: 7 (ref 5–15)
BUN: <5 mg/dL — ABNORMAL LOW (ref 6–20)
CO2: 23 mmol/L (ref 22–32)
Calcium: 9.5 mg/dL (ref 8.9–10.3)
Chloride: 106 mmol/L (ref 98–111)
Creatinine, Ser: 0.56 mg/dL (ref 0.44–1.00)
GFR, Estimated: >60 mL/min (ref >60)
Glucose, Bld: 119 mg/dL — ABNORMAL HIGH (ref 70–99)
Potassium: 3.1 mmol/L — ABNORMAL LOW (ref 3.5–5.1)
Sodium: 136 mmol/L (ref 135–145)
Total Bilirubin: 0.5 mg/dL (ref 0.3–1.2)
Total Protein: 7.4 g/dL (ref 6.5–8.1)

## 2021-05-30 LAB — URINALYSIS, MICROSCOPIC (REFLEX)

## 2021-05-30 LAB — CBC
HCT: 35.4 % — ABNORMAL LOW (ref 36.0–46.0)
Hemoglobin: 11.7 g/dL — ABNORMAL LOW (ref 12.0–15.0)
MCH: 25.1 pg — ABNORMAL LOW (ref 26.0–34.0)
MCHC: 33.1 g/dL (ref 30.0–36.0)
MCV: 76 fL — ABNORMAL LOW (ref 80.0–100.0)
Platelets: 226 10*3/uL (ref 150–400)
RBC: 4.66 MIL/uL (ref 3.87–5.11)
RDW: 17.9 % — ABNORMAL HIGH (ref 11.5–15.5)
WBC: 8 10*3/uL (ref 4.0–10.5)
nRBC: 0 % (ref 0.0–0.2)

## 2021-05-30 LAB — PREGNANCY, URINE: Preg Test, Ur: NEGATIVE

## 2021-05-30 LAB — LIPASE, BLOOD: Lipase: 38 U/L (ref 11–51)

## 2021-05-30 MED ORDER — ONDANSETRON 4 MG PO TBDP
ORAL_TABLET | ORAL | 0 refills | Status: DC
  Filled 2021-05-30: qty 20, 7d supply, fill #0

## 2021-05-30 MED ORDER — POTASSIUM CHLORIDE CRYS ER 20 MEQ PO TBCR
40.0000 meq | EXTENDED_RELEASE_TABLET | Freq: Once | ORAL | Status: AC
  Administered 2021-05-30: 40 meq via ORAL
  Filled 2021-05-30: qty 2

## 2021-05-30 MED ORDER — SODIUM CHLORIDE 0.9 % IV BOLUS
1000.0000 mL | Freq: Once | INTRAVENOUS | Status: AC
  Administered 2021-05-30: 1000 mL via INTRAVENOUS

## 2021-05-30 MED ORDER — ONDANSETRON HCL 4 MG/2ML IJ SOLN
4.0000 mg | Freq: Once | INTRAMUSCULAR | Status: AC
  Administered 2021-05-30: 4 mg via INTRAVENOUS
  Filled 2021-05-30: qty 2

## 2021-05-30 MED ORDER — DICYCLOMINE HCL 20 MG PO TABS
ORAL_TABLET | ORAL | 0 refills | Status: DC
  Filled 2021-05-30: qty 20, 10d supply, fill #0

## 2021-05-30 MED ORDER — POTASSIUM CHLORIDE ER 10 MEQ PO TBCR
10.0000 meq | EXTENDED_RELEASE_TABLET | Freq: Every day | ORAL | 1 refills | Status: DC
  Filled 2021-05-30: qty 14, 14d supply, fill #0

## 2021-05-30 MED ORDER — ONDANSETRON 8 MG PO TBDP
8.0000 mg | ORAL_TABLET | Freq: Three times a day (TID) | ORAL | 0 refills | Status: DC | PRN
  Filled 2021-05-30: qty 20, 7d supply, fill #0

## 2021-05-30 NOTE — Discharge Instructions (Addendum)
Take Zofran every 8 hours as needed for nausea and vomiting.  Take the potassium supplements for the next week, follow-up with your primary in a week or so to have blood work reevaluation as needed.  Follow-up if you are continuing to have pain.  Return to the ED if you are unable to tolerate anything by mouth without vomiting. ?

## 2021-05-30 NOTE — ED Notes (Signed)
PT TOLERATING PO LIQUIDS.

## 2021-05-30 NOTE — ED Triage Notes (Addendum)
C/o vomiting/diarrhea & abdominal pain since Friday. States syncopal episode Friday & today. States her kids and husband also had vomiting/diarrhea. States unable to tolerate PO. ?

## 2021-05-30 NOTE — ED Provider Notes (Signed)
?[facility] EMERGENCY DEPARTMENT ?Provider Note ? ? ?CSN: [id number] ?Arrival date & time: 05/30/21  1234 ? ?  ? ?History ? ?Chief Complaint  ?Patient presents with  ? Abdominal Pain  ? ? ?Sandy Perez is a 31 y.o. female. ? ? ?Abdominal Pain ? ?Patient with medical history notable for prior C-section and bilateral tubal ligation, PCOS, migraines presents today with nausea, vomiting, diarrhea x2 days.Her entire family is sick with similar symptoms, she denies eating at any new restaurants, recent travel or antibiotic use.  She is having diffuse abdominal cramping which is intermittent and worse whenever she is having episodes of emesis.  She has not tried any over-the-counter medicine. ? ?Earlier today patient had a presyncopal episode when she got up she felt lightheaded and sat back down.  The sensation went away, there is no prodromal chest pain or shortness of breath and she did not ask actually have a syncopal event. ? ?Home Medications ?Prior to Admission medications   ?Medication Sig Start Date End Date Taking? Authorizing Provider  ?albuterol (VENTOLIN HFA) 108 (90 Base) MCG/ACT inhaler Inhale 1-2 puffs by mouth into the lungs every 6 (six) hours as needed for wheezing or shortness of breath. 11/23/20   Copland, Gwenlyn Found, MD  ?clonazePAM (KLONOPIN) 0.5 MG tablet TAKE 1/2 - 1 TABLET BY MOUTH TWO TIMES DAILY AS NEEDED FOR ANXIETY 12/06/20 06/20/21  Copland, Gwenlyn Found, MD  ?dicyclomine (BENTYL) 20 MG tablet Take 1 tablet by mouth twice a day 05/27/21     ?meloxicam (MOBIC) 7.5 MG tablet Take 1 tablet (7.5 mg total) by mouth daily. 04/21/21   Sandford Craze, NP  ?methocarbamol (ROBAXIN) 500 MG tablet Take 1 tablet (500 mg total) by mouth every 8 (eight) hours as needed for muscle spasms. 04/21/21   Sandford Craze, NP  ?omeprazole (PRILOSEC) 40 MG capsule TAKE 1 CAPSULE BY MOUTH ONCE DAILY 04/05/21 04/05/22  Copland, Gwenlyn Found, MD  ?ondansetron (ZOFRAN-ODT) 4 MG disintegrating tablet Dissolve 1 tablet by  mouth every 8 hours as needed for nausea 05/27/21     ?sertraline (ZOLOFT) 100 MG tablet Take 2 tablets (200 mg total) by mouth daily. 02/09/21 02/09/22  Copland, Gwenlyn Found, MD  ?   ? ?Allergies    ?Aripiprazole, Imitrex  [sumatriptan], and Wellbutrin [bupropion]   ? ?Review of Systems   ?Review of Systems  ?Gastrointestinal:  Positive for abdominal pain.  ? ?Physical Exam ?Updated Vital Signs ?BP 133/72   Pulse 78   Temp 98.2 ?F (36.8 ?C) (Oral)   Resp 16   Ht 5\' 5"  (1.651 m)   Wt 117.9 kg   LMP 05/02/2021 (Approximate)   SpO2 100%   BMI 43.27 kg/m?  ?Physical Exam ?Vitals and nursing note reviewed. Exam conducted with a chaperone present.  ?Constitutional:   ?   Appearance: Normal appearance.  ?HENT:  ?   Head: Normocephalic and atraumatic.  ?Eyes:  ?   General: No scleral icterus.    ?   Right eye: No discharge.     ?   Left eye: No discharge.  ?   Extraocular Movements: Extraocular movements intact.  ?   Pupils: Pupils are equal, round, and reactive to light.  ?Cardiovascular:  ?   Rate and Rhythm: Normal rate and regular rhythm.  ?   Pulses: Normal pulses.  ?   Heart sounds: Normal heart sounds. No murmur heard. ?  No friction rub. No gallop.  ?Pulmonary:  ?   Effort: Pulmonary effort is normal.  No respiratory distress.  ?   Breath sounds: Normal breath sounds.  ?Abdominal:  ?   General: Abdomen is flat. Bowel sounds are normal. There is no distension.  ?   Palpations: Abdomen is soft.  ?   Tenderness: There is generalized abdominal tenderness. There is no guarding.  ?Skin: ?   General: Skin is warm and dry.  ?   Coloration: Skin is not jaundiced.  ?Neurological:  ?   Mental Status: She is alert. Mental status is at baseline.  ?   Coordination: Coordination normal.  ? ? ?ED Results / Procedures / Treatments   ?Labs ?(all labs ordered are listed, but only abnormal results are displayed) ?Labs Reviewed  ?COMPREHENSIVE METABOLIC PANEL - Abnormal; Notable for the following components:  ?    Result Value  ?  Potassium 3.1 (*)   ? Glucose, Bld 119 (*)   ? BUN <5 (*)   ? All other components within normal limits  ?CBC - Abnormal; Notable for the following components:  ? Hemoglobin 11.7 (*)   ? HCT 35.4 (*)   ? MCV 76.0 (*)   ? MCH 25.1 (*)   ? RDW 17.9 (*)   ? All other components within normal limits  ?URINALYSIS, ROUTINE W REFLEX MICROSCOPIC - Abnormal; Notable for the following components:  ? APPearance HAZY (*)   ? Bilirubin Urine SMALL (*)   ? Ketones, ur 40 (*)   ? Protein, ur >=300 (*)   ? All other components within normal limits  ?URINALYSIS, MICROSCOPIC (REFLEX) - Abnormal; Notable for the following components:  ? Bacteria, UA FEW (*)   ? All other components within normal limits  ?LIPASE, BLOOD  ?PREGNANCY, URINE  ? ? ?EKG ?None ? ?Radiology ?No results found. ? ?Procedures ?Procedures  ? ? ?Medications Ordered in ED ?Medications  ?sodium chloride 0.9 % bolus 1,000 mL (1,000 mLs Intravenous New Bag/Given 05/30/21 1524)  ?ondansetron (ZOFRAN) injection 4 mg (4 mg Intravenous Given 05/30/21 1524)  ?potassium chloride SA (KLOR-CON M) CR tablet 40 mEq (40 mEq Oral Given 05/30/21 1557)  ? ? ?ED Course/ Medical Decision Making/ A&P ?Clinical Course as of 05/30/21 1638  ?Tue May 30, 2021  ?1538 Amorphous Crystal: PRESENT [HS]  ?  ?Clinical Course User Index ?[HS] Theron AristaSage, Genessa Beman, PA-C  ? ?                        ?Medical Decision Making ?Amount and/or Complexity of Data Reviewed ?Labs: ordered. Decision-making details documented in ED Course. ? ?Risk ?Prescription drug management. ? ? ?This patient presents to the ED for concern of N/V/D/AP, this involves an extensive number of treatment options, and is a complaint that carries with it a high risk of complications and morbidity.  The differential diagnosis includes but not limited to gastroenteritis, gastritis, C. difficile, dehydration, electrolyte derangement ? ? ?Additional history obtained:  ? ?I reviewed external records including patient's surgical history of C-section  and tubal ligation as well as her PCOS. ? ?  ?Lab Tests: ? ?I ordered, viewed, and personally interpreted labs.  The pertinent results include:   ?No leukocytosis, stable anemia with a hemoglobin 11.7.  Lipase is within normal limits.   Slight hypokalemia with a potassium of 3.1, no AKI or LFT.  UA with ketonuria and proteinuria but no underlying infectious findings ? ?  ?ECG/Cardiac monitoring:  ? ?Per my interpretation, EKG shows NSR ? ?The patient was maintained on a cardiac monitor.  Visualized monitor strip which showed NSR per my interpretation.  ? ? ?Medicines ordered and prescription drug management: ? ?I ordered medication including: Fluid bolus, Zofran, potassium ? ?I have reviewed the patients home medicines and have made adjustments as needed ? ? ?Test Considered: ? ?Considered imaging but ultimately I think this is gastroenteritis.  Patient does not have any focal tenderness on exam so I do not think a CT abdomen be particularly illuminating.  Discussed with patient who is in agreement. ? ? ?Reevaluation: ? ?After the interventions noted above, I reevaluated the patient and found patient feels improved, tolerating PO.  ? ? ?Problems addressed / ED Course: ?Nausea, vomiting, diarrhea and abdominal pain-I suspect this is likely gastroenteritis.  Patient's laboratory work-up is unremarkable although she is slightly hypokalemic.  Given fluid bolus and Zofran, on reevaluation she seems improved.  Doubt acute abdomen given no peritoneal signs and do not think imaging needed at this time.  Will discharge with Zofran and potassium supplements, close follow up with primary care doctor encouraged.  Return precautions discussed.  Patient was discharged in stable condition. ?  ?Social Determinants of Health: ?Young, has PCP ?  ?Disposition: ? ? ?After consideration of the diagnostic results and the patients response to treatment, I feel that the patent would benefit from outpatient F/U as needed. ? ?   ? ? ? ? ? ? ? ? ?Final Clinical Impression(s) / ED Diagnoses ?Final diagnoses:  ?None  ? ? ?Rx / DC Orders ?ED Discharge Orders   ? ? None  ? ?  ? ? ?  ?Theron Arista, PA-C ?05/30/21 2359 ? ?  ?Sloan Leiter, DO ?06/01/21

## 2021-06-02 ENCOUNTER — Emergency Department (HOSPITAL_BASED_OUTPATIENT_CLINIC_OR_DEPARTMENT_OTHER)
Admission: EM | Admit: 2021-06-02 | Discharge: 2021-06-03 | Disposition: A | Discharge status: Home / Self Care | Payer: Medicaid Other | Attending: Emergency Medicine | Admitting: Emergency Medicine

## 2021-06-02 ENCOUNTER — Encounter (HOSPITAL_BASED_OUTPATIENT_CLINIC_OR_DEPARTMENT_OTHER): Payer: Self-pay | Admitting: *Deleted

## 2021-06-02 ENCOUNTER — Other Ambulatory Visit: Payer: Self-pay

## 2021-06-02 DIAGNOSIS — R1012 Left upper quadrant pain: Secondary | ICD-10-CM | POA: Insufficient documentation

## 2021-06-02 DIAGNOSIS — Z20822 Contact with and (suspected) exposure to covid-19: Secondary | ICD-10-CM | POA: Insufficient documentation

## 2021-06-02 DIAGNOSIS — N9489 Other specified conditions associated with female genital organs and menstrual cycle: Secondary | ICD-10-CM | POA: Diagnosis not present

## 2021-06-02 DIAGNOSIS — R1033 Periumbilical pain: Secondary | ICD-10-CM | POA: Insufficient documentation

## 2021-06-02 DIAGNOSIS — R197 Diarrhea, unspecified: Secondary | ICD-10-CM | POA: Diagnosis not present

## 2021-06-02 DIAGNOSIS — R109 Unspecified abdominal pain: Secondary | ICD-10-CM | POA: Diagnosis present

## 2021-06-02 LAB — COMPREHENSIVE METABOLIC PANEL
ALT: 27 U/L (ref 0–44)
AST: 20 U/L (ref 15–41)
Albumin: 4 g/dL (ref 3.5–5.0)
Alkaline Phosphatase: 61 U/L (ref 38–126)
Anion gap: 6 (ref 5–15)
BUN: 6 mg/dL (ref 6–20)
CO2: 25 mmol/L (ref 22–32)
Calcium: 9.8 mg/dL (ref 8.9–10.3)
Chloride: 106 mmol/L (ref 98–111)
Creatinine, Ser: 0.68 mg/dL (ref 0.44–1.00)
GFR, Estimated: >60 mL/min (ref >60)
Glucose, Bld: 116 mg/dL — ABNORMAL HIGH (ref 70–99)
Potassium: 3.3 mmol/L — ABNORMAL LOW (ref 3.5–5.1)
Sodium: 137 mmol/L (ref 135–145)
Total Bilirubin: 0.4 mg/dL (ref 0.3–1.2)
Total Protein: 7.6 g/dL (ref 6.5–8.1)

## 2021-06-02 LAB — CBC
HCT: 36.4 % (ref 36.0–46.0)
Hemoglobin: 12 g/dL (ref 12.0–15.0)
MCH: 25.2 pg — ABNORMAL LOW (ref 26.0–34.0)
MCHC: 33 g/dL (ref 30.0–36.0)
MCV: 76.5 fL — ABNORMAL LOW (ref 80.0–100.0)
Platelets: 260 10*3/uL (ref 150–400)
RBC: 4.76 MIL/uL (ref 3.87–5.11)
RDW: 18 % — ABNORMAL HIGH (ref 11.5–15.5)
WBC: 7.6 10*3/uL (ref 4.0–10.5)
nRBC: 0 % (ref 0.0–0.2)

## 2021-06-02 LAB — LIPASE, BLOOD: Lipase: 42 U/L (ref 11–51)

## 2021-06-02 MED ORDER — METOCLOPRAMIDE HCL 5 MG/ML IJ SOLN
10.0000 mg | Freq: Once | INTRAMUSCULAR | Status: AC
  Administered 2021-06-02: 10 mg via INTRAVENOUS
  Filled 2021-06-02: qty 2

## 2021-06-02 MED ORDER — SODIUM CHLORIDE 0.9 % IV BOLUS
1000.0000 mL | Freq: Once | INTRAVENOUS | Status: AC
  Administered 2021-06-02: 1000 mL via INTRAVENOUS

## 2021-06-02 MED ORDER — DIPHENHYDRAMINE HCL 25 MG PO CAPS
25.0000 mg | ORAL_CAPSULE | Freq: Once | ORAL | Status: AC
  Administered 2021-06-02: 25 mg via ORAL
  Filled 2021-06-02: qty 1

## 2021-06-02 MED ORDER — MORPHINE SULFATE (PF) 4 MG/ML IV SOLN
4.0000 mg | Freq: Once | INTRAVENOUS | Status: AC
  Administered 2021-06-02: 4 mg via INTRAVENOUS
  Filled 2021-06-02: qty 1

## 2021-06-02 NOTE — ED Triage Notes (Signed)
LUQ pain with n/v 2 days ago. Endorses diarrhea. States this is he 3rd ED visit this week for same complaint ?

## 2021-06-02 NOTE — Discharge Instructions (Addendum)
Your CT scan is reassuring.  Take the nausea medication as prescribed as well as Imodium as needed for diarrhea.  Follow-up with your doctor.  Return to the ED with new or worsening symptoms ?

## 2021-06-02 NOTE — ED Provider Notes (Signed)
?[facility] EMERGENCY DEPARTMENT ?Provider Note ? ? ?CSN: [id number] ?Arrival date & time: 06/02/21  2100 ? ?  ? ?History ? ?Chief Complaint  ?Patient presents with  ? Abdominal Pain  ? ? ?Sandy MortonLaura J Franckowiak is a 31 y.o. female. ? ? ?Abdominal Pain ?Past surgical history notable for C-section and tubal ligation both done in 2019 ? ?Patient is a 31 year old female with past medical history notable for reflux, Amenorrhea, PCOS, anemia, prediabetes ? ?Patient states that she has been having abdominal pain for the past 1 week seems that she has been to the emergency room twice since her symptoms began.  She states the last time she had a CT scan was over a year ago. ? ?She denies any fevers denies any blood in her stool but states that she is having 5-10 episodes of nonbloody diarrhea per day.  She states that her last episode was at 8 PM this evening.  She states no vomiting over the past 3 to 4 days since she was discharged with Zofran but has continued to have nausea in spite of Zofran. ? ?No cough, chest pain, lightheadedness, dizziness. ? ?  ? ?Home Medications ?Prior to Admission medications   ?Medication Sig Start Date End Date Taking? Authorizing Provider  ?albuterol (VENTOLIN HFA) 108 (90 Base) MCG/ACT inhaler Inhale 1-2 puffs by mouth into the lungs every 6 (six) hours as needed for wheezing or shortness of breath. 11/23/20   Copland, Gwenlyn FoundJessica C, MD  ?clonazePAM (KLONOPIN) 0.5 MG tablet TAKE 1/2 - 1 TABLET BY MOUTH TWO TIMES DAILY AS NEEDED FOR ANXIETY 12/06/20 06/20/21  Copland, Gwenlyn FoundJessica C, MD  ?dicyclomine (BENTYL) 20 MG tablet Take 1 tablet by mouth twice a day 05/27/21     ?meloxicam (MOBIC) 7.5 MG tablet Take 1 tablet (7.5 mg total) by mouth daily. 04/21/21   Sandford Craze'Sullivan, Melissa, NP  ?methocarbamol (ROBAXIN) 500 MG tablet Take 1 tablet (500 mg total) by mouth every 8 (eight) hours as needed for muscle spasms. 04/21/21   Sandford Craze'Sullivan, Melissa, NP  ?omeprazole (PRILOSEC) 40 MG capsule TAKE 1 CAPSULE BY MOUTH ONCE  DAILY 04/05/21 04/05/22  Copland, Gwenlyn FoundJessica C, MD  ?ondansetron (ZOFRAN-ODT) 8 MG disintegrating tablet Take 1 tablet (8 mg total) by mouth every 8 (eight) hours as needed. 05/30/21   Theron AristaSage, Haley, PA-C  ?potassium chloride (KLOR-CON) 10 MEQ tablet Take 1 tablet (10 mEq total) by mouth daily. 05/30/21   Theron AristaSage, Haley, PA-C  ?sertraline (ZOLOFT) 100 MG tablet Take 2 tablets (200 mg total) by mouth daily. 02/09/21 02/09/22  Copland, Gwenlyn FoundJessica C, MD  ?   ? ?Allergies    ?Aripiprazole, Imitrex  [sumatriptan], and Wellbutrin [bupropion]   ? ?Review of Systems   ?Review of Systems  ?Gastrointestinal:  Positive for abdominal pain.  ? ?Physical Exam ?Updated Vital Signs ?BP (!) 151/86   Pulse 84   Temp 98.4 ?F (36.9 ?C) (Oral)   Resp 20   Ht 5\' 5"  (1.651 m)   Wt 117.9 kg   LMP 05/31/2021   SpO2 100%   BMI 43.27 kg/m?  ?Physical Exam ?Vitals and nursing note reviewed.  ?Constitutional:   ?   Appearance: She is obese.  ?   Comments: 31 year old female pleasant, able answer questions verbally, follows commands, appears uncomfortable but in no acute distress  ?HENT:  ?   Head: Normocephalic and atraumatic.  ?   Nose: Nose normal.  ?   Mouth/Throat:  ?   Mouth: Mucous membranes are dry.  ?Eyes:  ?  General: No scleral icterus. ?Cardiovascular:  ?   Rate and Rhythm: Normal rate and regular rhythm.  ?   Pulses: Normal pulses.  ?   Heart sounds: Normal heart sounds.  ?Pulmonary:  ?   Effort: Pulmonary effort is normal. No respiratory distress.  ?   Breath sounds: No wheezing.  ?Abdominal:  ?   Palpations: Abdomen is soft.  ?   Tenderness: There is abdominal tenderness. There is no right CVA tenderness or left CVA tenderness.  ? ? ?   Comments: Negative Nonda Lou, Rovsing  ?Musculoskeletal:  ?   Cervical back: Normal range of motion.  ?   Right lower leg: No edema.  ?   Left lower leg: No edema.  ?Skin: ?   General: Skin is warm and dry.  ?   Capillary Refill: Capillary refill takes less than 2 seconds.  ?Neurological:  ?    Mental Status: She is alert. Mental status is at baseline.  ?Psychiatric:     ?   Mood and Affect: Mood normal.     ?   Behavior: Behavior normal.  ? ? ?ED Results / Procedures / Treatments   ?Labs ?(all labs ordered are listed, but only abnormal results are displayed) ?Labs Reviewed  ?RESP PANEL BY RT-PCR (FLU A&B, COVID) ARPGX2  ?HCG, QUANTITATIVE, PREGNANCY  ?LIPASE, BLOOD  ?COMPREHENSIVE METABOLIC PANEL  ?CBC  ?URINALYSIS, ROUTINE W REFLEX MICROSCOPIC  ? ? ?EKG ?None ? ?Radiology ?No results found. ? ?Procedures ?Procedures  ? ? ?Medications Ordered in ED ?Medications  ?sodium chloride 0.9 % bolus 1,000 mL (1,000 mLs Intravenous New Bag/Given (Non-Interop) 06/02/21 2328)  ?morphine (PF) 4 MG/ML injection 4 mg (4 mg Intravenous Given 06/02/21 2332)  ?metoCLOPramide (REGLAN) injection 10 mg (10 mg Intravenous Given 06/02/21 2329)  ?diphenhydrAMINE (BENADRYL) capsule 25 mg (25 mg Oral Given 06/02/21 2329)  ? ? ?ED Course/ Medical Decision Making/ A&P ?Clinical Course as of 06/02/21 2344  ?Fri Jun 02, 2021  ?2315 1 week of abd pain ?No vomiting since 3-4 days ago but continued nausea. Some mild improvement with zofran.  ?Diarrhea last episode 8pm.  ?5-10 episodes of diarrhea a day.  [WF]  ?  ?Clinical Course User Index ?[WF] Gailen Shelter, [place]  ? ?                        ?Medical Decision Making ?Amount and/or Complexity of Data Reviewed ?Labs: ordered. ?Radiology: ordered. ? ?Risk ?Prescription drug management. ? ? ?This patient presents to the ED for concern of abd pain, diarrhea, this involves a number of treatment options, and is a complaint that carries with it a moderate risk of complications and morbidity.  The differential diagnosis includes The differential diagnosis of diarrhea includes but is not limited to ?Viral- norovirus/rotavirus; Bacterial-Campylobacter,Shigella, Salmonella, Escherichia coli, E. coli 0157:H7, Yersinia enterocolitica, Vibrio cholerae, Clostridium difficile. ?Parasitic- Giardia lamblia,  Cryptosporidium,Entamoeba histolytica,Cyclospora, Microsporidium. ?Toxin- Staphylococcus aureus, Bacillus cereus. Noninfectious causes include GI Bleed, Appendicitis, Mesenteric Ischemia, Diverticulitis, Adrenal Crisis, Thyroid Storm, Toxicologic exposures, Antibiotic or drug-associated, inflammatory bowel disease. ? ? ? ?Co morbidities: ?Discussed in HPI ? ? ?Brief History: ? ?Patient is a 31 year old female with past medical history notable for reflux, Amenorrhea, PCOS, anemia, prediabetes ? ?Patient states that she has been having abdominal pain for the past 1 week seems that she has been to the emergency room twice since her symptoms began.  She states the last time she had a CT scan was over  a year ago. ? ?She denies any fevers denies any blood in her stool but states that she is having 5-10 episodes of nonbloody diarrhea per day.  She states that her last episode was at 8 PM this evening.  She states no vomiting over the past 3 to 4 days since she was discharged with Zofran but has continued to have nausea in spite of Zofran. ? ?No cough, chest pain, lightheadedness, dizziness. ? ? ?Physical exam notable for periumbilical tenderness, left sided abdominal tenderness most notably in the left upper quadrant. ? ? ? ?EMR reviewed including pt PMHx, past surgical history and past visits to ER.  ? ?See HPI for more details ? ? ?Lab Tests: ? ? ?I ordered and independently interpreted labs. Labs notable for CBC without leukocytosis/anemia  ? ? ?Imaging Studies: ? ?CT abd/pel ordered and pending at shift change ? ? ? ?Cardiac Monitoring: ? ?NA ?NA ? ? ?Medicines ordered: ? ?I ordered medication including morphine, benadryl, reglan  and 1L NS for NVD and abd pain ?Reevaluation of the patient after these medicines showed that the patient improved ?I have reviewed the patients home medicines and have made adjustments as needed ? ? ?Critical Interventions: ? ? ? ? ?Consults/Attending  Physician ? ? ? ? ? ?Reevaluation: ? ?After the interventions noted above I re-evaluated patient and found that they have :improved ? ? ?Social Determinants of Health: ? ?The patient's social determinants of health were a factor in the care of this pati

## 2021-06-03 ENCOUNTER — Emergency Department (HOSPITAL_BASED_OUTPATIENT_CLINIC_OR_DEPARTMENT_OTHER): Payer: Medicaid Other

## 2021-06-03 LAB — RESP PANEL BY RT-PCR (FLU A&B, COVID) ARPGX2
Influenza A by PCR: NEGATIVE
Influenza B by PCR: NEGATIVE
SARS Coronavirus 2 by RT PCR: NEGATIVE

## 2021-06-03 LAB — HCG, QUANTITATIVE, PREGNANCY: hCG, Beta Chain, Quant, S: <1 m[IU]/mL (ref <5)

## 2021-06-03 MED ORDER — IOHEXOL 300 MG/ML  SOLN
100.0000 mL | Freq: Once | INTRAMUSCULAR | Status: AC | PRN
  Administered 2021-06-03: 100 mL via INTRAVENOUS

## 2021-06-03 MED ORDER — POTASSIUM CHLORIDE CRYS ER 20 MEQ PO TBCR
40.0000 meq | EXTENDED_RELEASE_TABLET | Freq: Once | ORAL | Status: AC
  Administered 2021-06-03: 40 meq via ORAL
  Filled 2021-06-03: qty 2

## 2021-06-03 NOTE — ED Notes (Signed)
Pt given water and crackers for po challenge

## 2021-06-03 NOTE — ED Notes (Signed)
Patient transported to CT 

## 2021-06-03 NOTE — ED Provider Notes (Signed)
CT scan is reassuring with no acute pathology.  No evidence of cholecystitis or appendicitis or bowel obstruction. ? ?No diarrhea throughout 5-hour ED stay.  Tolerating p.o.  Abdomen soft without peritoneal signs. ? ?Patient unable to give a stool sample ? ?No evidence of acute surgical pathology.  No evidence of dehydration requiring admission. ?Anticipate discharge home with supportive care ?Return precautions discussed.  ?  Glynn Octave, MD ?06/03/21 [phone] ? ?

## 2021-06-03 NOTE — ED Notes (Signed)
Pt tolerating PO intake

## 2021-06-12 ENCOUNTER — Other Ambulatory Visit (HOSPITAL_BASED_OUTPATIENT_CLINIC_OR_DEPARTMENT_OTHER): Payer: Self-pay

## 2021-06-12 ENCOUNTER — Encounter: Payer: Self-pay | Admitting: Family Medicine

## 2021-06-12 ENCOUNTER — Other Ambulatory Visit: Payer: Self-pay | Admitting: Family Medicine

## 2021-06-12 DIAGNOSIS — F32A Depression, unspecified: Secondary | ICD-10-CM

## 2021-06-12 MED ORDER — CLONAZEPAM 0.5 MG PO TABS
ORAL_TABLET | ORAL | 0 refills | Status: DC
  Filled 2021-06-12: qty 60, fill #0
  Filled 2021-06-16 – 2021-07-06 (×2): qty 60, 30d supply, fill #0

## 2021-06-13 ENCOUNTER — Emergency Department: Admit: 2021-06-13 | Discharge status: Absent without leave | Payer: Medicaid Other | Source: Home / Self Care

## 2021-06-13 ENCOUNTER — Emergency Department (INDEPENDENT_AMBULATORY_CARE_PROVIDER_SITE_OTHER)
Admission: EM | Admit: 2021-06-13 | Discharge: 2021-06-13 | Disposition: A | Discharge status: Home / Self Care | Payer: Medicaid Other | Source: Home / Self Care | Attending: Family Medicine | Admitting: Family Medicine

## 2021-06-13 ENCOUNTER — Emergency Department (INDEPENDENT_AMBULATORY_CARE_PROVIDER_SITE_OTHER): Payer: Medicaid Other

## 2021-06-13 VITALS — BP 135/88 | HR 91 | Temp 98.5°F | Resp 20 | Ht 65.0 in | Wt 260.0 lb

## 2021-06-13 DIAGNOSIS — S90121A Contusion of right lesser toe(s) without damage to nail, initial encounter: Secondary | ICD-10-CM

## 2021-06-13 DIAGNOSIS — M79671 Pain in right foot: Secondary | ICD-10-CM | POA: Diagnosis not present
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
## 2021-06-13 NOTE — Discharge Instructions (Signed)
Apply ice pack for 10 minutes, 2 to 3 times daily  Continue until pain and swelling decrease.  May take ibuprofen as needed.  Elevate foot when possible for about 2 days. ?
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
## 2021-06-16 ENCOUNTER — Other Ambulatory Visit (HOSPITAL_BASED_OUTPATIENT_CLINIC_OR_DEPARTMENT_OTHER): Payer: Self-pay

## 2021-06-22 ENCOUNTER — Other Ambulatory Visit (HOSPITAL_BASED_OUTPATIENT_CLINIC_OR_DEPARTMENT_OTHER): Payer: Self-pay

## 2021-07-06 ENCOUNTER — Other Ambulatory Visit (HOSPITAL_BASED_OUTPATIENT_CLINIC_OR_DEPARTMENT_OTHER): Payer: Self-pay

## 2021-07-07 ENCOUNTER — Other Ambulatory Visit (HOSPITAL_BASED_OUTPATIENT_CLINIC_OR_DEPARTMENT_OTHER): Payer: Self-pay

## 2021-07-11 ENCOUNTER — Other Ambulatory Visit (HOSPITAL_BASED_OUTPATIENT_CLINIC_OR_DEPARTMENT_OTHER): Payer: Self-pay
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
## 2021-08-07 ENCOUNTER — Ambulatory Visit: Payer: Medicaid Other | Admitting: Family Medicine

## 2021-08-14 ENCOUNTER — Encounter (HOSPITAL_BASED_OUTPATIENT_CLINIC_OR_DEPARTMENT_OTHER): Payer: Self-pay | Admitting: Emergency Medicine

## 2021-08-14 ENCOUNTER — Emergency Department (HOSPITAL_BASED_OUTPATIENT_CLINIC_OR_DEPARTMENT_OTHER)
Admission: EM | Admit: 2021-08-14 | Discharge: 2021-08-14 | Disposition: A | Discharge status: Home / Self Care | Payer: Medicaid Other | Attending: Emergency Medicine | Admitting: Emergency Medicine

## 2021-08-14 ENCOUNTER — Other Ambulatory Visit: Payer: Self-pay

## 2021-08-14 DIAGNOSIS — H9202 Otalgia, left ear: Secondary | ICD-10-CM | POA: Diagnosis present

## 2021-08-14 DIAGNOSIS — H6692 Otitis media, unspecified, left ear: Secondary | ICD-10-CM | POA: Insufficient documentation

## 2021-08-14 DIAGNOSIS — H6690 Otitis media, unspecified, unspecified ear: Secondary | ICD-10-CM

## 2021-08-14 MED ORDER — AMOXICILLIN 500 MG PO CAPS: 1000.0000 mg | ORAL_CAPSULE | Freq: Two times a day (BID) | ORAL | 0 refills | Status: AC

## 2021-08-14 MED ORDER — IBUPROFEN 800 MG PO TABS
800.0000 mg | ORAL_TABLET | Freq: Once | ORAL | Status: AC
  Administered 2021-08-14: 800 mg via ORAL
  Filled 2021-08-14: qty 1

## 2021-08-14 MED ORDER — AMOXICILLIN 500 MG PO CAPS
1000.0000 mg | ORAL_CAPSULE | Freq: Once | ORAL | Status: AC
  Administered 2021-08-14: 1000 mg via ORAL
  Filled 2021-08-14: qty 2

## 2021-08-16 ENCOUNTER — Other Ambulatory Visit (HOSPITAL_BASED_OUTPATIENT_CLINIC_OR_DEPARTMENT_OTHER): Payer: Self-pay

## 2021-08-17 ENCOUNTER — Other Ambulatory Visit (HOSPITAL_BASED_OUTPATIENT_CLINIC_OR_DEPARTMENT_OTHER): Payer: Self-pay
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
## 2021-09-11 ENCOUNTER — Ambulatory Visit: Payer: Medicaid Other | Admitting: Family Medicine
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
## 2021-09-20 ENCOUNTER — Ambulatory Visit (INDEPENDENT_AMBULATORY_CARE_PROVIDER_SITE_OTHER): Payer: Medicaid Other | Admitting: Family Medicine

## 2021-09-20 VITALS — BP 118/78 | HR 78 | Temp 97.6°F | Resp 18 | Ht 65.0 in | Wt 258.4 lb

## 2021-09-20 DIAGNOSIS — Z803 Family history of malignant neoplasm of breast: Secondary | ICD-10-CM

## 2021-09-20 DIAGNOSIS — L84 Corns and callosities: Secondary | ICD-10-CM

## 2021-09-20 NOTE — Patient Instructions (Addendum)
Urea and salicylic acid foot cream - this may help to get the thick skin off your feet so they can heal Wear shoes to protect your feet and reduce callus build up Let me know if not improving!  I will get you set up with medical genetics to check for any increased risk for breast cancer

## 2021-10-10 ENCOUNTER — Other Ambulatory Visit: Payer: Self-pay | Admitting: Family Medicine

## 2021-10-10 ENCOUNTER — Encounter: Payer: Self-pay | Admitting: Family Medicine

## 2021-10-10 ENCOUNTER — Other Ambulatory Visit (HOSPITAL_BASED_OUTPATIENT_CLINIC_OR_DEPARTMENT_OTHER): Payer: Self-pay

## 2021-10-10 DIAGNOSIS — F32A Depression, unspecified: Secondary | ICD-10-CM

## 2021-10-10 DIAGNOSIS — K219 Gastro-esophageal reflux disease without esophagitis: Secondary | ICD-10-CM

## 2021-10-10 MED ORDER — CLONAZEPAM 0.5 MG PO TABS
ORAL_TABLET | ORAL | 0 refills | Status: DC
  Filled 2021-10-10: qty 60, 30d supply, fill #0

## 2021-10-10 MED ORDER — OMEPRAZOLE 40 MG PO CPDR
40.0000 mg | DELAYED_RELEASE_CAPSULE | Freq: Every day | ORAL | 1 refills | Status: DC
  Filled 2021-10-10: qty 90, 90d supply, fill #0
  Filled 2022-01-23: qty 90, 90d supply, fill #1

## 2021-11-13 ENCOUNTER — Other Ambulatory Visit: Payer: Self-pay | Admitting: Family Medicine

## 2021-11-13 ENCOUNTER — Other Ambulatory Visit (HOSPITAL_BASED_OUTPATIENT_CLINIC_OR_DEPARTMENT_OTHER): Payer: Self-pay

## 2021-11-13 DIAGNOSIS — F419 Anxiety disorder, unspecified: Secondary | ICD-10-CM

## 2021-11-13 MED ORDER — CLONAZEPAM 0.5 MG PO TABS
ORAL_TABLET | ORAL | 1 refills | Status: DC
  Filled 2021-11-13: qty 60, 30d supply, fill #0
  Filled 2021-12-11: qty 60, 30d supply, fill #1

## 2021-12-11 ENCOUNTER — Other Ambulatory Visit (HOSPITAL_BASED_OUTPATIENT_CLINIC_OR_DEPARTMENT_OTHER): Payer: Self-pay

## 2021-12-11 ENCOUNTER — Other Ambulatory Visit: Payer: Self-pay | Admitting: Family Medicine

## 2021-12-12 ENCOUNTER — Other Ambulatory Visit (HOSPITAL_BASED_OUTPATIENT_CLINIC_OR_DEPARTMENT_OTHER): Payer: Self-pay

## 2021-12-12 MED ORDER — ALBUTEROL SULFATE HFA 108 (90 BASE) MCG/ACT IN AERS
1.0000 | INHALATION_SPRAY | Freq: Four times a day (QID) | RESPIRATORY_TRACT | 2 refills | Status: DC | PRN
  Filled 2021-12-12: qty 18, 25d supply, fill #0
  Filled 2022-01-24: qty 18, 25d supply, fill #1

## 2022-01-01 ENCOUNTER — Ambulatory Visit (INDEPENDENT_AMBULATORY_CARE_PROVIDER_SITE_OTHER): Payer: Medicaid Other | Admitting: Nurse Practitioner

## 2022-01-01 ENCOUNTER — Encounter: Payer: Self-pay | Admitting: Nurse Practitioner

## 2022-01-01 VITALS — BP 126/78 | HR 90 | Temp 97.3°F | Ht 65.0 in | Wt 260.6 lb

## 2022-01-01 DIAGNOSIS — J0110 Acute frontal sinusitis, unspecified: Secondary | ICD-10-CM

## 2022-01-01 MED ORDER — BENZONATATE 100 MG PO CAPS: 100.0000 mg | ORAL_CAPSULE | Freq: Three times a day (TID) | ORAL | 0 refills | Status: DC | PRN

## 2022-01-01 MED ORDER — PREDNISONE 20 MG PO TABS: 40.0000 mg | ORAL_TABLET | Freq: Every day | ORAL | 0 refills | Status: DC

## 2022-01-01 MED ORDER — DOXYCYCLINE HYCLATE 100 MG PO TABS: 100.0000 mg | ORAL_TABLET | Freq: Two times a day (BID) | ORAL | 0 refills | Status: DC

## 2022-01-01 NOTE — Progress Notes (Signed)
Acute Office Visit  Subjective:     Patient ID: Sandy Perez, female    DOB: May 21, 1990, 31 y.o.   MRN: [id number]  Chief Complaint  Patient presents with   Acute Visit    C/o having cough and SOB x 2 weeks.  RSV is going around at school.   No OTC meds.  Negative at home covid test today.     HPI Patient is in today for cough and shortness of breath for 2 weeks. Home covid-19 test today was negative.   UPPER RESPIRATORY TRACT INFECTION  Fever: yes - maybe a week ago Cough: yes productive- light brown Shortness of breath: yes Wheezing: yes Chest pain: no Chest tightness: no Chest congestion: yes Nasal congestion: yes Runny nose: no Post nasal drip: yes Sneezing: no Sore throat: no Swollen glands: no Sinus pressure: no Headache: yes Face pain: no Toothache: no Ear pain: yes bilateral - a week ago Ear pressure: yes bilateral Eyes red/itching:no Eye drainage/crusting: no  Vomiting: no Rash: no Fatigue: yes Sick contacts:  work in a preschool  Strep contacts: no  Context: worse Recurrent sinusitis: no Relief with OTC cold/cough medications: no  Treatments attempted: albuterol inhaler    ROS See pertinent positives and negatives per HPI.     Objective:    BP 126/78   Pulse 90   Temp (!) 97.3 F (36.3 C) (Temporal)   Ht 5\' 5"  (1.651 m)   Wt 260 lb 9.6 oz (118.2 kg)   SpO2 99%   BMI 43.37 kg/m    Physical Exam Vitals and nursing note reviewed.  Constitutional:      General: She is not in acute distress.    Appearance: Normal appearance.  HENT:     Head: Normocephalic.     Right Ear: Tympanic membrane, ear canal and external ear normal.     Left Ear: Tympanic membrane, ear canal and external ear normal.     Nose:     Right Sinus: Frontal sinus tenderness present. No maxillary sinus tenderness.     Left Sinus: Frontal sinus tenderness present. No maxillary sinus tenderness.  Eyes:     Conjunctiva/sclera: Conjunctivae normal.  Cardiovascular:      Rate and Rhythm: Normal rate and regular rhythm.     Pulses: Normal pulses.     Heart sounds: Normal heart sounds.  Pulmonary:     Effort: Pulmonary effort is normal.     Breath sounds: Normal breath sounds.  Musculoskeletal:     Cervical back: Normal range of motion and neck supple. No tenderness.  Lymphadenopathy:     Cervical: No cervical adenopathy.  Skin:    General: Skin is warm.  Neurological:     General: No focal deficit present.     Mental Status: She is alert and oriented to person, place, and time.  Psychiatric:        Mood and Affect: Mood normal.        Behavior: Behavior normal.        Thought Content: Thought content normal.        Judgment: Judgment normal.      Assessment & Plan:   Problem List Items Addressed This Visit   None Visit Diagnoses     Acute non-recurrent frontal sinusitis    -  Primary   Will treat with doxycycline x10 days and prednisone x5 days. Encourage fluids, rest. Tessalon and mucinex prn cough. Work note given. F/U if not improving.   Relevant Medications  predniSONE (DELTASONE) 20 MG tablet   doxycycline (VIBRA-TABS) 100 MG tablet   benzonatate (TESSALON) 100 MG capsule       Meds ordered this encounter  Medications   predniSONE (DELTASONE) 20 MG tablet    Sig: Take 2 tablets (40 mg total) by mouth daily with breakfast.    Dispense:  10 tablet    Refill:  0   doxycycline (VIBRA-TABS) 100 MG tablet    Sig: Take 1 tablet (100 mg total) by mouth 2 (two) times daily.    Dispense:  20 tablet    Refill:  0   benzonatate (TESSALON) 100 MG capsule    Sig: Take 1 capsule (100 mg total) by mouth 3 (three) times daily as needed for cough.    Dispense:  30 capsule    Refill:  0    Return if symptoms worsen or fail to improve.  Gerre Scull, NP

## 2022-01-01 NOTE — Patient Instructions (Addendum)
It was great to see you!  Start prednisone 2 tablets once a day for 5 days in the morning with food.  Start doxycyline 1 tablet twice a day for 10 days with food.   You can also take mucinex twice a day as needed for cough and congestion.   You can take tessalon 3 times a day as needed for cough.   Make sure you are drinking plenty of fluids.   Let's follow-up if your symptoms worsen or don't improve.   Take care,  Rodman Pickle, NP

## 2022-01-23 ENCOUNTER — Other Ambulatory Visit (HOSPITAL_BASED_OUTPATIENT_CLINIC_OR_DEPARTMENT_OTHER): Payer: Self-pay

## 2022-01-23 ENCOUNTER — Other Ambulatory Visit: Payer: Self-pay | Admitting: Family Medicine

## 2022-01-23 DIAGNOSIS — F32A Depression, unspecified: Secondary | ICD-10-CM

## 2022-01-23 MED ORDER — CLONAZEPAM 0.5 MG PO TABS
0.2500 mg | ORAL_TABLET | Freq: Two times a day (BID) | ORAL | 1 refills | Status: DC | PRN
  Filled 2022-01-23: qty 60, 30d supply, fill #0
  Filled 2022-03-17 – 2022-04-09 (×2): qty 60, 30d supply, fill #1

## 2022-01-25 ENCOUNTER — Other Ambulatory Visit (HOSPITAL_BASED_OUTPATIENT_CLINIC_OR_DEPARTMENT_OTHER): Payer: Self-pay

## 2022-01-29 ENCOUNTER — Ambulatory Visit: Admit: 2022-01-29 | Payer: Medicaid Other

## 2022-01-30 ENCOUNTER — Ambulatory Visit
Admission: RE | Admit: 2022-01-30 | Discharge: 2022-01-30 | Disposition: A | Discharge status: Home / Self Care | Payer: Medicaid Other | Source: Ambulatory Visit | Attending: Emergency Medicine | Admitting: Emergency Medicine

## 2022-01-30 VITALS — BP 150/97 | HR 81 | Temp 98.0°F | Resp 16

## 2022-01-30 DIAGNOSIS — J4521 Mild intermittent asthma with (acute) exacerbation: Secondary | ICD-10-CM

## 2022-01-30 DIAGNOSIS — J309 Allergic rhinitis, unspecified: Secondary | ICD-10-CM

## 2022-01-30 MED ORDER — AEROCHAMBER PLUS FLO-VU LARGE MISC: 1.0000 | Freq: Once | 0 refills | Status: AC

## 2022-01-30 MED ORDER — FLUTICASONE PROPIONATE 50 MCG/ACT NA SUSP: 1.0000 | Freq: Every day | NASAL | 1 refills | Status: DC

## 2022-01-30 MED ORDER — ALBUTEROL SULFATE (2.5 MG/3ML) 0.083% IN NEBU
2.5000 mg | INHALATION_SOLUTION | Freq: Once | RESPIRATORY_TRACT | Status: AC
  Administered 2022-01-30: 2.5 mg via RESPIRATORY_TRACT

## 2022-01-30 MED ORDER — ALBUTEROL SULFATE HFA 108 (90 BASE) MCG/ACT IN AERS: 2.0000 | INHALATION_SPRAY | Freq: Four times a day (QID) | RESPIRATORY_TRACT | 2 refills | Status: DC | PRN

## 2022-01-30 MED ORDER — CETIRIZINE HCL 10 MG PO TABS: 10.0000 mg | ORAL_TABLET | Freq: Every day | ORAL | 1 refills | Status: AC

## 2022-01-30 NOTE — Discharge Instructions (Signed)
Your symptoms and my physical exam findings are concerning for exacerbation of your underlying allergies.   To avoid catching frequent respiratory infections, having skin reactions, dealing with eye irritation, losing sleep, missing work, etc., due to uncontrolled allergies, it is important that you begin/continue your allergy regimen and are consistent with taking your meds exactly as prescribed.   Please see the list below for recommended medications, dosages and frequencies to provide relief of current symptoms:     Zyrtec (cetirizine): This is an excellent second-generation antihistamine that helps to reduce respiratory inflammatory response to environmental allergens.  In some patients, this medication can cause daytime sleepiness so I recommend that you take 1 tablet daily at bedtime.     Flonase (fluticasone): This is a steroid nasal spray that you use once daily, 1 spray in each nare.  This medication does not work well if you decide to use it only used as you feel you need to, it works best used on a daily basis.  After 3 to 5 days of use, you will notice significant reduction of the inflammation and mucus production that is currently being caused by exposure to allergens, whether seasonal or environmental.  The most common side effect of this medication is nosebleeds.  If you experience a nosebleed, please discontinue use for 1 week, then feel free to resume.  I have provided you with a prescription.     ProAir, Ventolin, Proventil (albuterol): This inhaled medication contains a short acting beta agonist bronchodilator.  This medication works on the smooth muscle that opens and constricts of your airways by relaxing the muscle.  The result of relaxation of the smooth muscle is increased air movement and improved work of breathing.  This is a short acting medication that can be used every 4-6 hours as needed for increased work of breathing, shortness of breath, wheezing and excessive coughing.  I  have provided you with a prescription.    Advil, Motrin (ibuprofen): This is a good anti-inflammatory medication which not only addresses aches, pains but also significantly reduces soft tissue inflammation of the upper airways that causes sinus and nasal congestion as well as inflammation of the lower airways which makes you feel like your breathing is constricted or your cough feel tight.  I recommend that you take 400 mg every 8 hours as needed.  Just over-the-counter.   If you find that you have not had improvement of your symptoms in the next 5 to 7 days, please follow-up with your primary care provider or return here to urgent care for repeat evaluation and further recommendations.   Thank you for visiting urgent care today.  We appreciate the opportunity to participate in your care.
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
## 2022-01-30 NOTE — ED Triage Notes (Signed)
The patient states she has been having labored breathing, SOB, body aches, vomiting, low grade fever and headache for about a month. The patient states she was diagnosed about a month ago with bronchitis. The patient does not display physical sign of respiratory distress at this time while sitting.   Home interventions: tylenol

## 2022-02-09 ENCOUNTER — Other Ambulatory Visit (HOSPITAL_BASED_OUTPATIENT_CLINIC_OR_DEPARTMENT_OTHER): Payer: Self-pay
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
## 2022-02-14 ENCOUNTER — Telehealth: Payer: Self-pay

## 2022-02-14 ENCOUNTER — Ambulatory Visit (HOSPITAL_BASED_OUTPATIENT_CLINIC_OR_DEPARTMENT_OTHER)
Admission: RE | Admit: 2022-02-14 | Discharge: 2022-02-14 | Disposition: A | Discharge status: Home / Self Care | Payer: Medicaid Other | Source: Ambulatory Visit | Attending: Family Medicine | Admitting: Family Medicine

## 2022-02-14 ENCOUNTER — Other Ambulatory Visit (HOSPITAL_BASED_OUTPATIENT_CLINIC_OR_DEPARTMENT_OTHER): Payer: Self-pay

## 2022-02-14 ENCOUNTER — Ambulatory Visit (INDEPENDENT_AMBULATORY_CARE_PROVIDER_SITE_OTHER): Payer: Medicaid Other | Admitting: Family Medicine

## 2022-02-14 VITALS — BP 128/74 | HR 97 | Temp 98.0°F | Resp 16 | Ht 66.0 in | Wt 262.0 lb

## 2022-02-14 DIAGNOSIS — J189 Pneumonia, unspecified organism: Secondary | ICD-10-CM

## 2022-02-14 DIAGNOSIS — R053 Chronic cough: Secondary | ICD-10-CM | POA: Diagnosis present

## 2022-02-14 MED ORDER — MONTELUKAST SODIUM 10 MG PO TABS
10.0000 mg | ORAL_TABLET | Freq: Every day | ORAL | 3 refills | Status: DC
  Filled 2022-02-14: qty 30, 30d supply, fill #0
  Filled 2022-03-17 – 2022-04-09 (×2): qty 30, 30d supply, fill #1
  Filled 2022-05-11: qty 30, 30d supply, fill #2
  Filled 2022-06-26: qty 30, 30d supply, fill #3

## 2022-02-14 MED ORDER — AZITHROMYCIN 250 MG PO TABS
ORAL_TABLET | ORAL | 0 refills | Status: AC
  Filled 2022-02-14: qty 6, 5d supply, fill #0

## 2022-02-14 MED ORDER — FLUTICASONE PROPIONATE HFA 110 MCG/ACT IN AERO
2.0000 | INHALATION_SPRAY | Freq: Two times a day (BID) | RESPIRATORY_TRACT | 12 refills | Status: DC
  Filled 2022-02-14: qty 12, 30d supply, fill #0
  Filled 2022-03-17 – 2022-04-09 (×2): qty 12, 30d supply, fill #1
  Filled 2022-05-11: qty 12, 30d supply, fill #2
  Filled 2022-06-26: qty 12, 30d supply, fill #3
  Filled 2022-08-20 (×2): qty 12, 30d supply, fill #4
  Filled 2022-09-10 – 2022-09-12 (×2): qty 12, 30d supply, fill #5

## 2022-02-14 NOTE — Telephone Encounter (Signed)
Call report from [facility]-   IMPRESSION: Lingular pneumonia. Followup PA and lateral chest X-ray is recommended in 3-4 weeks following trial of antibiotic therapy to ensure resolution and exclude underlying malignancy.

## 2022-02-14 NOTE — Patient Instructions (Signed)
Good to see you today- I hope you are feeling better soon! Try adding flovent inhaled steroid- 2 puffs twice a day every day, regardless of symptoms And add singulair once a day Let me know if either of these drugs is too expensive or not covered by insurance for you I will be in touch with your chest x-ray report as well Try to keep your kitten out of your bedroom

## 2022-02-14 NOTE — Telephone Encounter (Signed)
PCP verbally made aware.

## 2022-02-15 ENCOUNTER — Other Ambulatory Visit (HOSPITAL_BASED_OUTPATIENT_CLINIC_OR_DEPARTMENT_OTHER): Payer: Self-pay

## 2022-03-01 ENCOUNTER — Encounter: Payer: Self-pay | Admitting: Family Medicine

## 2022-03-17 ENCOUNTER — Other Ambulatory Visit: Payer: Self-pay | Admitting: Family Medicine

## 2022-03-17 DIAGNOSIS — K219 Gastro-esophageal reflux disease without esophagitis: Secondary | ICD-10-CM

## 2022-03-17 DIAGNOSIS — F4323 Adjustment disorder with mixed anxiety and depressed mood: Secondary | ICD-10-CM

## 2022-03-19 ENCOUNTER — Other Ambulatory Visit (HOSPITAL_BASED_OUTPATIENT_CLINIC_OR_DEPARTMENT_OTHER): Payer: Self-pay

## 2022-03-19 ENCOUNTER — Other Ambulatory Visit: Payer: Self-pay

## 2022-03-19 MED ORDER — OMEPRAZOLE 40 MG PO CPDR
40.0000 mg | DELAYED_RELEASE_CAPSULE | Freq: Every day | ORAL | 1 refills | Status: DC
  Filled 2022-03-19 – 2022-04-09 (×2): qty 90, 90d supply, fill #0
  Filled 2022-08-20 (×2): qty 90, 90d supply, fill #1

## 2022-03-19 MED ORDER — SERTRALINE HCL 100 MG PO TABS
200.0000 mg | ORAL_TABLET | Freq: Every day | ORAL | 3 refills | Status: DC
  Filled 2022-03-19 – 2022-04-09 (×2): qty 180, 90d supply, fill #0
  Filled 2022-08-20 (×2): qty 180, 90d supply, fill #1
  Filled 2022-11-24: qty 180, 90d supply, fill #2
  Filled 2023-03-03: qty 180, 90d supply, fill #3

## 2022-03-26 ENCOUNTER — Other Ambulatory Visit (HOSPITAL_BASED_OUTPATIENT_CLINIC_OR_DEPARTMENT_OTHER): Payer: Self-pay

## 2022-04-09 ENCOUNTER — Other Ambulatory Visit (HOSPITAL_BASED_OUTPATIENT_CLINIC_OR_DEPARTMENT_OTHER): Payer: Self-pay

## 2022-04-13 ENCOUNTER — Other Ambulatory Visit (HOSPITAL_BASED_OUTPATIENT_CLINIC_OR_DEPARTMENT_OTHER): Payer: Self-pay

## 2022-04-24 ENCOUNTER — Ambulatory Visit
Admission: RE | Admit: 2022-04-24 | Discharge: 2022-04-24 | Disposition: A | Discharge status: Home / Self Care | Payer: Medicaid Other | Source: Ambulatory Visit | Attending: Emergency Medicine | Admitting: Emergency Medicine

## 2022-04-24 VITALS — BP 137/89 | HR 96 | Temp 98.2°F | Resp 18

## 2022-04-24 DIAGNOSIS — R6889 Other general symptoms and signs: Secondary | ICD-10-CM

## 2022-04-24 LAB — POCT INFLUENZA A/B
Influenza A, POC: NEGATIVE
Influenza B, POC: NEGATIVE
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
## 2022-04-24 NOTE — Discharge Instructions (Addendum)
Flu test is negative You likely have a flu-like illness that should resolve over the week with symptomatic care Drink lots of fluids! Continue ibuprofen or tylenol for aches, fever I recommend mucinex for cough and congestion Rest as much as possible

## 2022-04-24 NOTE — ED Triage Notes (Signed)
Patient reports since yesterday she developed sore throat, body aches, right ear pain and cough. Temp max yesterday was 101.2.

## 2022-06-12 ENCOUNTER — Ambulatory Visit
Admission: RE | Admit: 2022-06-12 | Discharge: 2022-06-12 | Disposition: A | Discharge status: Home / Self Care | Payer: Medicaid Other | Source: Ambulatory Visit | Attending: Family Medicine | Admitting: Family Medicine

## 2022-06-12 ENCOUNTER — Telehealth: Payer: Self-pay | Admitting: Emergency Medicine

## 2022-06-12 VITALS — BP 159/102 | HR 85 | Temp 98.0°F | Resp 17

## 2022-06-12 DIAGNOSIS — R030 Elevated blood-pressure reading, without diagnosis of hypertension: Secondary | ICD-10-CM

## 2022-06-12 DIAGNOSIS — S29011A Strain of muscle and tendon of front wall of thorax, initial encounter: Secondary | ICD-10-CM

## 2022-06-12 MED ORDER — CYCLOBENZAPRINE HCL 10 MG PO TABS: 10.0000 mg | ORAL_TABLET | Freq: Two times a day (BID) | ORAL | 0 refills | Status: AC | PRN

## 2022-06-12 MED ORDER — NAPROXEN SODIUM 550 MG PO TABS: 550.0000 mg | ORAL_TABLET | Freq: Two times a day (BID) | ORAL | 0 refills | Status: DC

## 2022-06-12 MED ORDER — KETOROLAC TROMETHAMINE 30 MG/ML IJ SOLN
30.0000 mg | Freq: Once | INTRAMUSCULAR | Status: AC
  Administered 2022-06-12: 30 mg via INTRAMUSCULAR

## 2022-06-12 NOTE — Discharge Instructions (Addendum)
Avoid heavy use of arm Take naproxen 2 x a day with food Take the flexeril muscle relaxer at night.  May take during day when NOT at work Ice to area for 20 min every couple of hours See your doctor if not better by next week Is important to follow-up on your elevated blood pressure

## 2022-06-12 NOTE — ED Triage Notes (Signed)
Pt c/o RT shoulder pain since this am. 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 Felt pain 30 mins later in same shoulder. Pain worsening throughout the day. Tylenol and ice prn. Last dose 3pm. Pain 8/10

## 2022-06-12 NOTE — ED Notes (Signed)
Call to [facility] regarding prior authorization for naproxen . RBTO to pharmacist for Naproxen  BID. Pt aware of change in dosage prior to discharge to home.
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
## 2022-06-26 ENCOUNTER — Other Ambulatory Visit (HOSPITAL_BASED_OUTPATIENT_CLINIC_OR_DEPARTMENT_OTHER): Payer: Self-pay

## 2022-06-26 ENCOUNTER — Other Ambulatory Visit: Payer: Self-pay | Admitting: Family Medicine

## 2022-06-26 ENCOUNTER — Other Ambulatory Visit: Payer: Self-pay

## 2022-06-26 DIAGNOSIS — F419 Anxiety disorder, unspecified: Secondary | ICD-10-CM

## 2022-06-26 MED ORDER — CLONAZEPAM 0.5 MG PO TABS
0.2500 mg | ORAL_TABLET | Freq: Two times a day (BID) | ORAL | 1 refills | Status: DC | PRN
Start: 2022-06-26 — End: 2023-03-03
  Filled 2022-06-26: qty 60, 30d supply, fill #0
  Filled 2022-09-10 – 2022-11-24 (×2): qty 60, 30d supply, fill #1

## 2022-07-21 NOTE — Patient Instructions (Signed)
It was good to see you again today, please keep me posted on how you are doing
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
## 2022-07-23 ENCOUNTER — Encounter: Payer: Self-pay | Admitting: Family Medicine

## 2022-07-23 ENCOUNTER — Ambulatory Visit (INDEPENDENT_AMBULATORY_CARE_PROVIDER_SITE_OTHER): Payer: Medicaid Other | Admitting: Family Medicine

## 2022-07-23 VITALS — BP 132/80 | HR 93 | Temp 97.7°F | Resp 18 | Ht 66.0 in | Wt 264.0 lb

## 2022-07-23 DIAGNOSIS — J189 Pneumonia, unspecified organism: Secondary | ICD-10-CM

## 2022-07-23 DIAGNOSIS — Z638 Other specified problems related to primary support group: Secondary | ICD-10-CM | POA: Diagnosis not present

## 2022-07-24 ENCOUNTER — Encounter: Payer: Self-pay | Admitting: Family Medicine

## 2022-07-24 ENCOUNTER — Ambulatory Visit (HOSPITAL_BASED_OUTPATIENT_CLINIC_OR_DEPARTMENT_OTHER)
Admission: RE | Admit: 2022-07-24 | Discharge: 2022-07-24 | Disposition: A | Discharge status: Home / Self Care | Payer: Medicaid Other | Source: Ambulatory Visit | Attending: Family Medicine | Admitting: Family Medicine

## 2022-07-24 DIAGNOSIS — J189 Pneumonia, unspecified organism: Secondary | ICD-10-CM

## 2022-07-24 DIAGNOSIS — R042 Hemoptysis: Secondary | ICD-10-CM

## 2022-07-27 ENCOUNTER — Telehealth (HOSPITAL_BASED_OUTPATIENT_CLINIC_OR_DEPARTMENT_OTHER): Payer: Self-pay

## 2022-08-03 ENCOUNTER — Telehealth (HOSPITAL_BASED_OUTPATIENT_CLINIC_OR_DEPARTMENT_OTHER): Payer: Self-pay

## 2022-08-17 ENCOUNTER — Encounter (HOSPITAL_BASED_OUTPATIENT_CLINIC_OR_DEPARTMENT_OTHER): Payer: Self-pay

## 2022-08-17 ENCOUNTER — Other Ambulatory Visit: Payer: Self-pay

## 2022-08-17 ENCOUNTER — Emergency Department (HOSPITAL_BASED_OUTPATIENT_CLINIC_OR_DEPARTMENT_OTHER)
Admission: EM | Admit: 2022-08-17 | Discharge: 2022-08-17 | Disposition: A | Discharge status: Home / Self Care | Payer: Medicaid Other | Attending: Emergency Medicine | Admitting: Emergency Medicine

## 2022-08-17 DIAGNOSIS — K529 Noninfective gastroenteritis and colitis, unspecified: Secondary | ICD-10-CM | POA: Diagnosis not present

## 2022-08-17 DIAGNOSIS — E860 Dehydration: Secondary | ICD-10-CM | POA: Diagnosis not present

## 2022-08-17 DIAGNOSIS — R112 Nausea with vomiting, unspecified: Secondary | ICD-10-CM | POA: Diagnosis present

## 2022-08-17 DIAGNOSIS — R7989 Other specified abnormal findings of blood chemistry: Secondary | ICD-10-CM | POA: Insufficient documentation

## 2022-08-17 DIAGNOSIS — Z1152 Encounter for screening for COVID-19: Secondary | ICD-10-CM | POA: Diagnosis not present

## 2022-08-17 LAB — CBC
HCT: 39.6 % (ref 36.0–46.0)
Hemoglobin: 12.9 g/dL (ref 12.0–15.0)
MCH: 26.1 pg (ref 26.0–34.0)
MCHC: 32.6 g/dL (ref 30.0–36.0)
MCV: 80 fL (ref 80.0–100.0)
Platelets: 182 10*3/uL (ref 150–400)
RBC: 4.95 MIL/uL (ref 3.87–5.11)
RDW: 16.3 % — ABNORMAL HIGH (ref 11.5–15.5)
WBC: 5.6 10*3/uL (ref 4.0–10.5)
nRBC: 0 % (ref 0.0–0.2)

## 2022-08-17 LAB — COMPREHENSIVE METABOLIC PANEL
ALT: 20 U/L (ref 0–44)
AST: 17 U/L (ref 15–41)
Albumin: 4.2 g/dL (ref 3.5–5.0)
Alkaline Phosphatase: 60 U/L (ref 38–126)
Anion gap: 9 (ref 5–15)
BUN: 7 mg/dL (ref 6–20)
CO2: 22 mmol/L (ref 22–32)
Calcium: 9.4 mg/dL (ref 8.9–10.3)
Chloride: 104 mmol/L (ref 98–111)
Creatinine, Ser: 0.72 mg/dL (ref 0.44–1.00)
GFR, Estimated: >60 mL/min (ref >60)
Glucose, Bld: 117 mg/dL — ABNORMAL HIGH (ref 70–99)
Potassium: 3.5 mmol/L (ref 3.5–5.1)
Sodium: 135 mmol/L (ref 135–145)
Total Bilirubin: 0.8 mg/dL (ref 0.3–1.2)
Total Protein: 8.1 g/dL (ref 6.5–8.1)

## 2022-08-17 LAB — LIPASE, BLOOD: Lipase: 31 U/L (ref 11–51)

## 2022-08-17 LAB — URINALYSIS, MICROSCOPIC (REFLEX)

## 2022-08-17 LAB — URINALYSIS, ROUTINE W REFLEX MICROSCOPIC
Glucose, UA: NEGATIVE mg/dL
Hgb urine dipstick: NEGATIVE
Ketones, ur: 40 mg/dL — AB
Leukocytes,Ua: NEGATIVE
Nitrite: NEGATIVE
Protein, ur: >=300 mg/dL — AB
Specific Gravity, Urine: >=1.03 (ref 1.005–1.030)
pH: 6 (ref 5.0–8.0)

## 2022-08-17 LAB — SARS CORONAVIRUS 2 BY RT PCR: SARS Coronavirus 2 by RT PCR: NEGATIVE

## 2022-08-17 LAB — PREGNANCY, URINE: Preg Test, Ur: NEGATIVE

## 2022-08-17 MED ORDER — ONDANSETRON HCL 4 MG/2ML IJ SOLN
4.0000 mg | Freq: Once | INTRAMUSCULAR | Status: AC
  Administered 2022-08-17: 4 mg via INTRAVENOUS
  Filled 2022-08-17: qty 2

## 2022-08-17 MED ORDER — ONDANSETRON 4 MG PO TBDP: 4.0000 mg | ORAL_TABLET | Freq: Three times a day (TID) | ORAL | 0 refills | Status: DC | PRN

## 2022-08-17 MED ORDER — KETOROLAC TROMETHAMINE 15 MG/ML IJ SOLN
15.0000 mg | Freq: Once | INTRAMUSCULAR | Status: AC
  Administered 2022-08-17: 15 mg via INTRAVENOUS
  Filled 2022-08-17: qty 1

## 2022-08-17 MED ORDER — ONDANSETRON 4 MG PO TBDP
4.0000 mg | ORAL_TABLET | Freq: Three times a day (TID) | ORAL | 0 refills | Status: DC | PRN
  Filled 2022-08-17: qty 20, 7d supply, fill #0

## 2022-08-17 MED ORDER — ACETAMINOPHEN 500 MG PO TABS
1000.0000 mg | ORAL_TABLET | Freq: Once | ORAL | Status: AC
  Administered 2022-08-17: 1000 mg via ORAL
  Filled 2022-08-17: qty 2

## 2022-08-17 MED ORDER — LACTATED RINGERS IV BOLUS
1000.0000 mL | Freq: Once | INTRAVENOUS | Status: AC
  Administered 2022-08-17: 1000 mL via INTRAVENOUS

## 2022-08-17 NOTE — ED Triage Notes (Signed)
Patient having N/V/D since yesterday. She stated her kids were sick with the same recently.
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
## 2022-08-17 NOTE — Discharge Instructions (Signed)
Thank you for coming to [facility] Emergency Department. You were seen for nausea/vomiting/diarrhea and dehydration. We did an exam, labs, and these showed dehydration.  You felt improved after fluids, pain medicine, and nausea medicine.  We have prescribed Zofran to take under the tongue once every 6-8 hours as needed for nausea vomiting in order to stay well-hydrated at home.  You can also use Imodium over-the-counter for diarrhea but do not use it for longer than 2 to 3 days.   Please follow up with your primary care provider within 1 week.   Do not hesitate to return to the ED or call 911 if you experience: -Worsening symptoms -Nausea vomiting so severe that you cannot eat or drink anything -Bloody diarrhea or vomit -Lightheadedness, passing out -Fevers/chills -Anything else that concerns you

## 2022-08-18 ENCOUNTER — Other Ambulatory Visit (HOSPITAL_COMMUNITY): Payer: Self-pay

## 2022-08-18 ENCOUNTER — Other Ambulatory Visit (HOSPITAL_BASED_OUTPATIENT_CLINIC_OR_DEPARTMENT_OTHER): Payer: Self-pay

## 2022-08-20 ENCOUNTER — Other Ambulatory Visit: Payer: Self-pay

## 2022-08-20 ENCOUNTER — Other Ambulatory Visit: Payer: Self-pay | Admitting: Family Medicine

## 2022-08-20 ENCOUNTER — Other Ambulatory Visit (HOSPITAL_BASED_OUTPATIENT_CLINIC_OR_DEPARTMENT_OTHER): Payer: Self-pay

## 2022-08-20 DIAGNOSIS — R053 Chronic cough: Secondary | ICD-10-CM

## 2022-08-20 MED ORDER — MONTELUKAST SODIUM 10 MG PO TABS
10.0000 mg | ORAL_TABLET | Freq: Every day | ORAL | 1 refills | Status: DC
Start: 2022-08-20 — End: 2023-10-28
  Filled 2022-08-20 – 2023-03-22 (×5): qty 30, 30d supply, fill #0
  Filled 2023-07-14: qty 30, 30d supply, fill #1

## 2022-08-31 ENCOUNTER — Other Ambulatory Visit (HOSPITAL_COMMUNITY): Payer: Self-pay

## 2022-09-06 ENCOUNTER — Other Ambulatory Visit (HOSPITAL_BASED_OUTPATIENT_CLINIC_OR_DEPARTMENT_OTHER): Payer: Self-pay

## 2022-09-11 ENCOUNTER — Other Ambulatory Visit (HOSPITAL_BASED_OUTPATIENT_CLINIC_OR_DEPARTMENT_OTHER): Payer: Self-pay

## 2022-09-11 ENCOUNTER — Other Ambulatory Visit: Payer: Self-pay

## 2022-09-17 ENCOUNTER — Encounter: Payer: Self-pay | Admitting: Family Medicine

## 2022-09-17 NOTE — Telephone Encounter (Signed)
Received message and called patient, but no answer.  Left message on her machine Will also respond to her MyChart message

## 2022-09-20 IMAGING — CT CT ABD-PELV W/ CM
2 of 4 series · 16 of 46 positions shown, 18 images · IV contrast (agent unspecified)
Comparison: CT chest abdomen and pelvis 03/01/2016.

CLINICAL DATA: Acute abdominal pain.

EXAM:
CT ABDOMEN AND PELVIS WITH CONTRAST
TECHNIQUE: Multidetector CT imaging of the abdomen and pelvis was performed
using the standard protocol following bolus administration of
intravenous contrast.

[Series 2: axial st · axial · 0.97mm/px · z∈[-376,+84]mm · 13 of 102 slices shown, 15 images]
[im 5/102  soft-tissue]
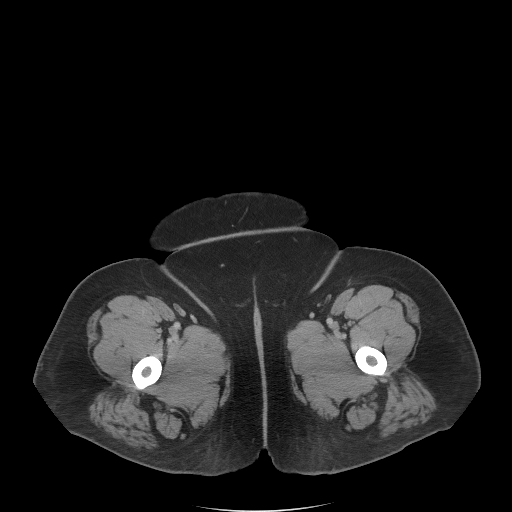
[im 5/102  bone]
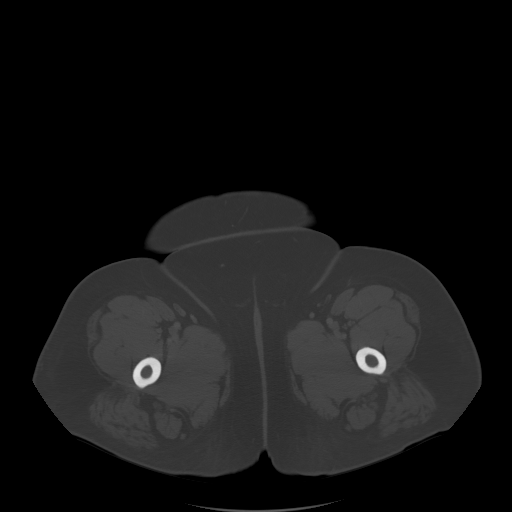
[im 14/102  soft-tissue]
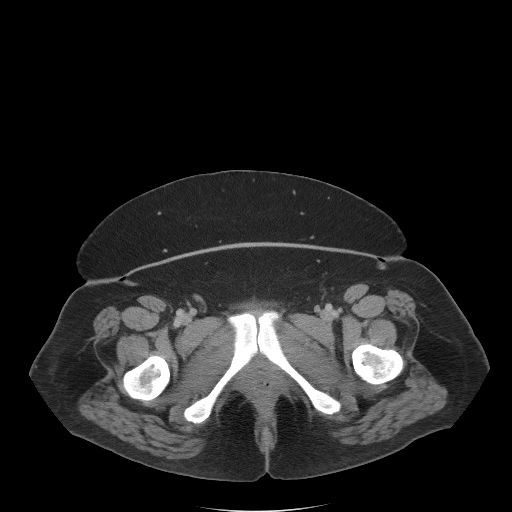
[im 23/102  soft-tissue]
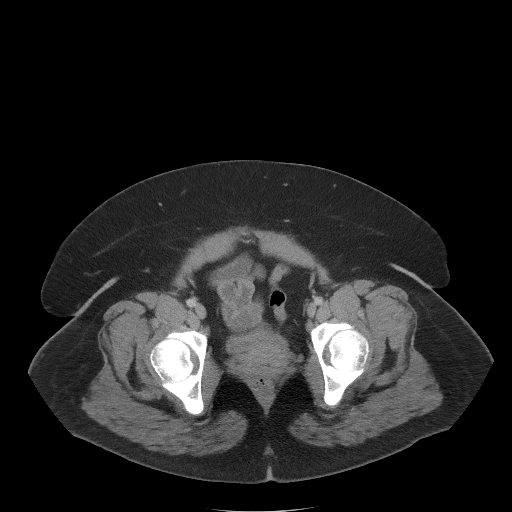
[im 28/102  soft-tissue]
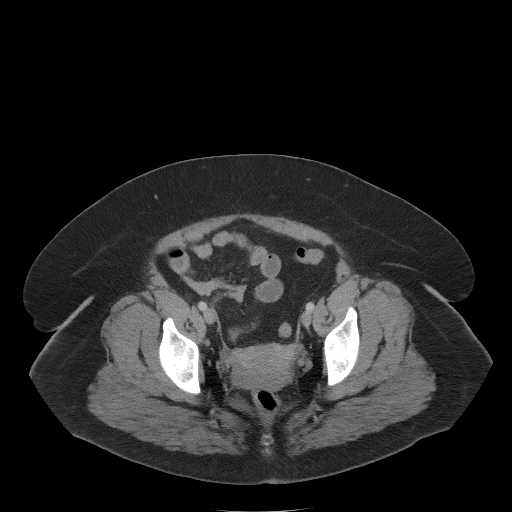
[im 37/102  soft-tissue]
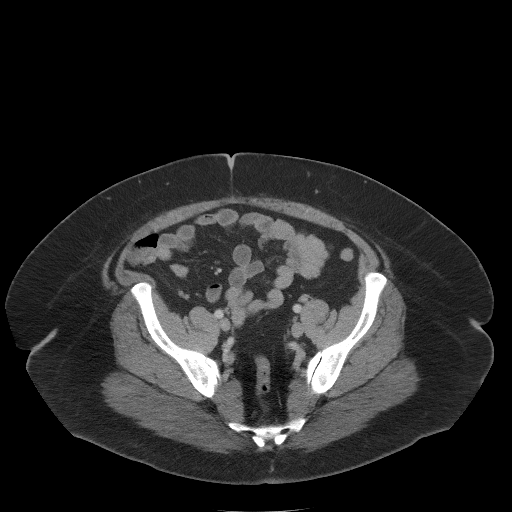
[im 42/102  soft-tissue]
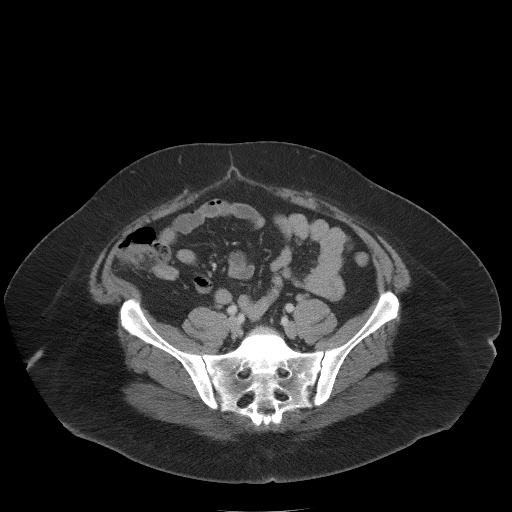
[im 51/102  soft-tissue]
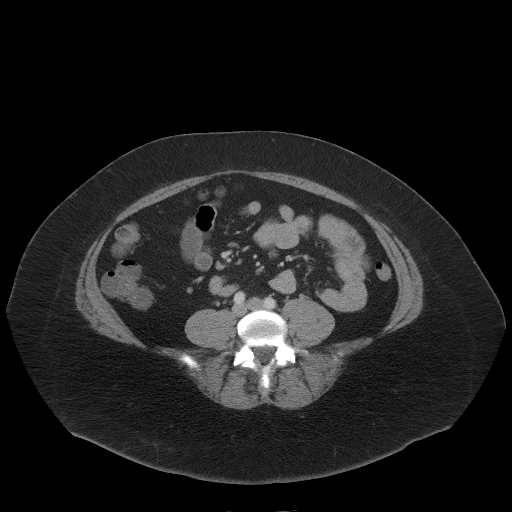
[im 60/102  soft-tissue]
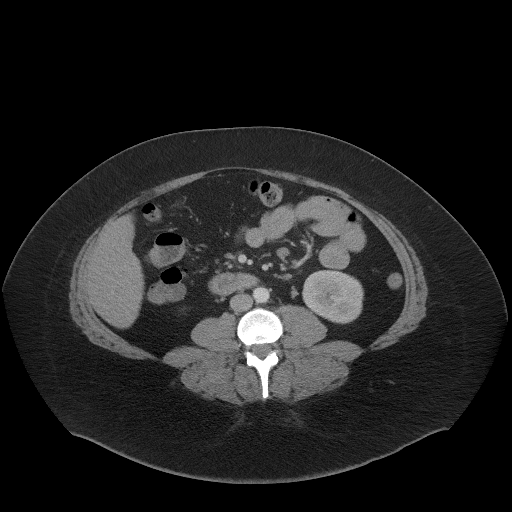
[im 65/102  soft-tissue]
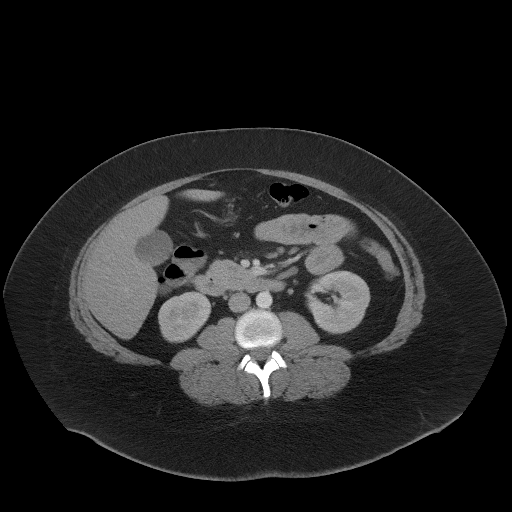
[im 65/102  bone]
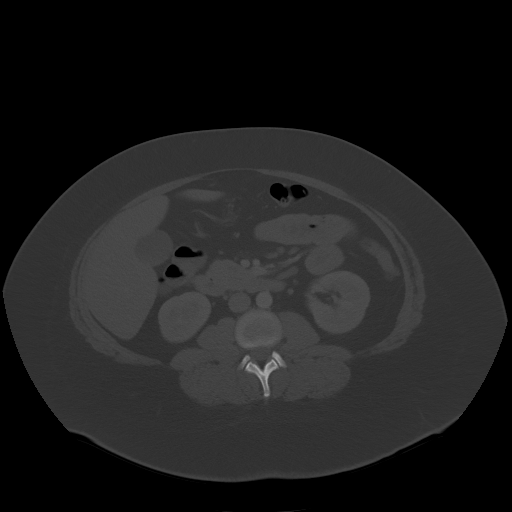
[im 74/102  soft-tissue]
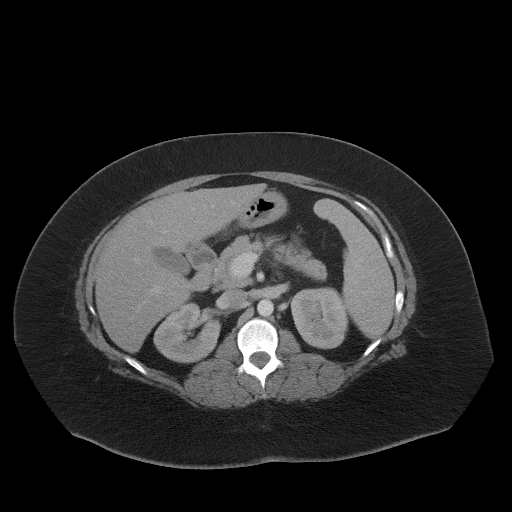
[im 79/102  soft-tissue]
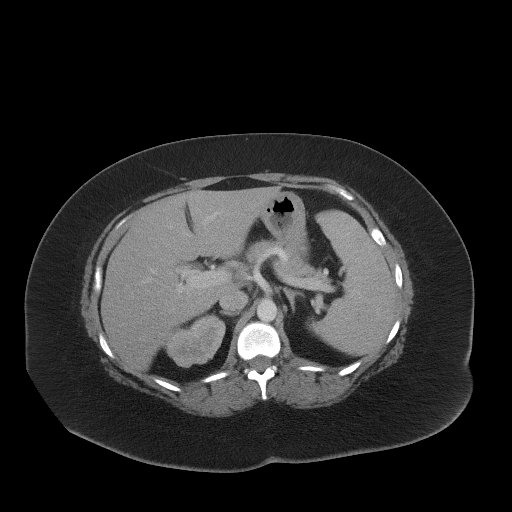
[im 88/102  soft-tissue]
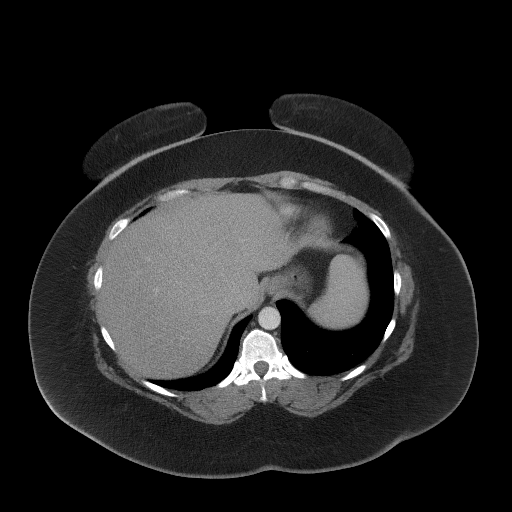
[im 97/102  soft-tissue]
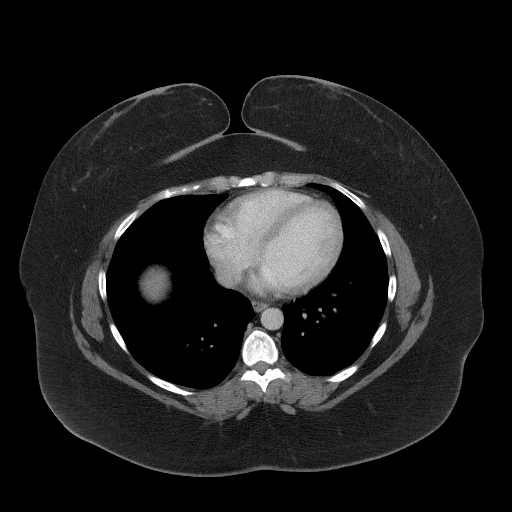

[Series 5: coronal st · coronal · 0.88mm/px · 3 of 125 slices shown]
[im 42/125  soft-tissue]
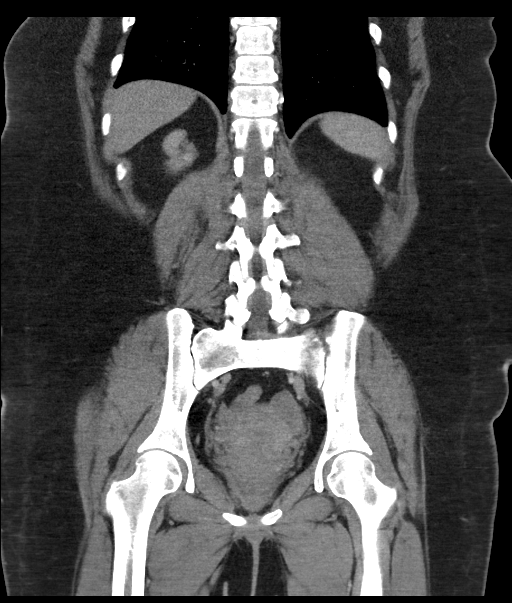
[im 56/125  soft-tissue]
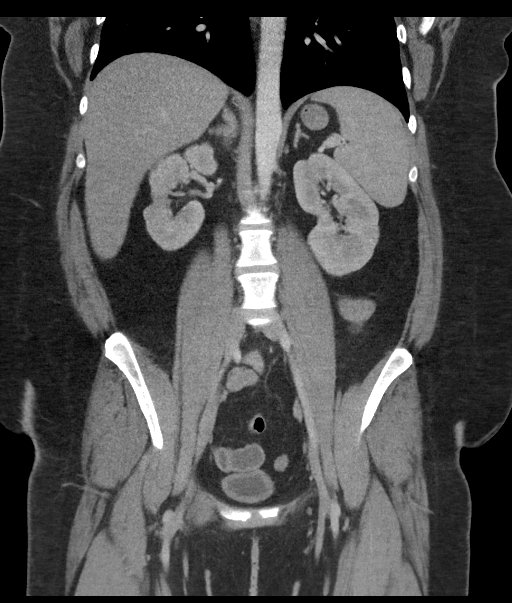
[im 69/125  soft-tissue]
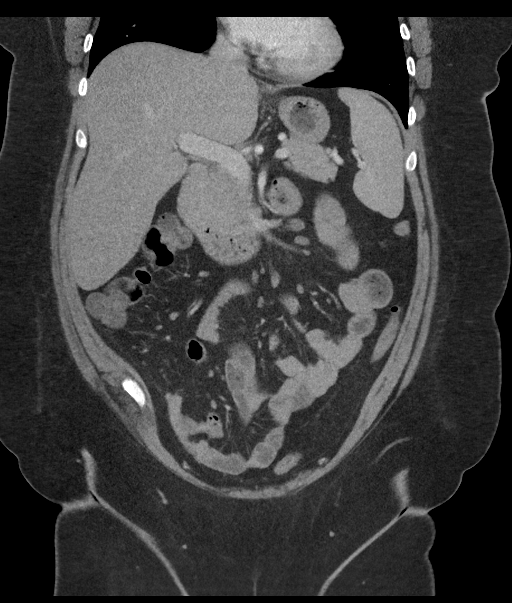

[16 of 46 positions shown; findings below may reference images not displayed]

RADIATION DOSE REDUCTION: This exam was performed according to the
departmental dose-optimization program which includes automated
exposure control, adjustment of the mA and/or kV according to
patient size and/or use of iterative reconstruction technique.

CONTRAST:  100mL OMNIPAQUE IOHEXOL 300 MG/ML  SOLN
FINDINGS: Lower chest: No acute abnormality.

Hepatobiliary: Mildly enlarged, unchanged. No focal liver
abnormality is seen. No gallstones, gallbladder wall thickening, or
biliary dilatation.

Pancreas: Unremarkable. No pancreatic ductal dilatation or
surrounding inflammatory changes.

Spleen: Mildly enlarged, unchanged.

Adrenals/Urinary Tract: There is right renal cortical scarring
similar to the prior study. Otherwise, the kidneys, bladder and
adrenal glands are within normal limits.

Stomach/Bowel: Stomach is within normal limits. Appendix appears
normal. No evidence of bowel wall thickening, distention, or
inflammatory changes.

Vascular/Lymphatic: No significant vascular findings are present. No
enlarged abdominal or pelvic lymph nodes.

Reproductive: Uterus and bilateral adnexa are unremarkable.

Other: No abdominal wall hernia or abnormality. No abdominopelvic
ascites.

Musculoskeletal: No acute or significant osseous findings.
IMPRESSION: No acute localizing process in the abdomen or pelvis.

Stable mild hepatosplenomegaly.

## 2022-09-25 ENCOUNTER — Other Ambulatory Visit (HOSPITAL_BASED_OUTPATIENT_CLINIC_OR_DEPARTMENT_OTHER): Payer: Self-pay

## 2022-09-30 IMAGING — DX DG FOOT COMPLETE 3+V*R*
3 series · 3 of 3 positions shown · non-contrast
Comparison: None.

CLINICAL DATA: Injury and pain. Dropped a cast iron pan on right
third toe.

EXAM:
RIGHT FOOT COMPLETE - 3+ VIEW

[foot ap]
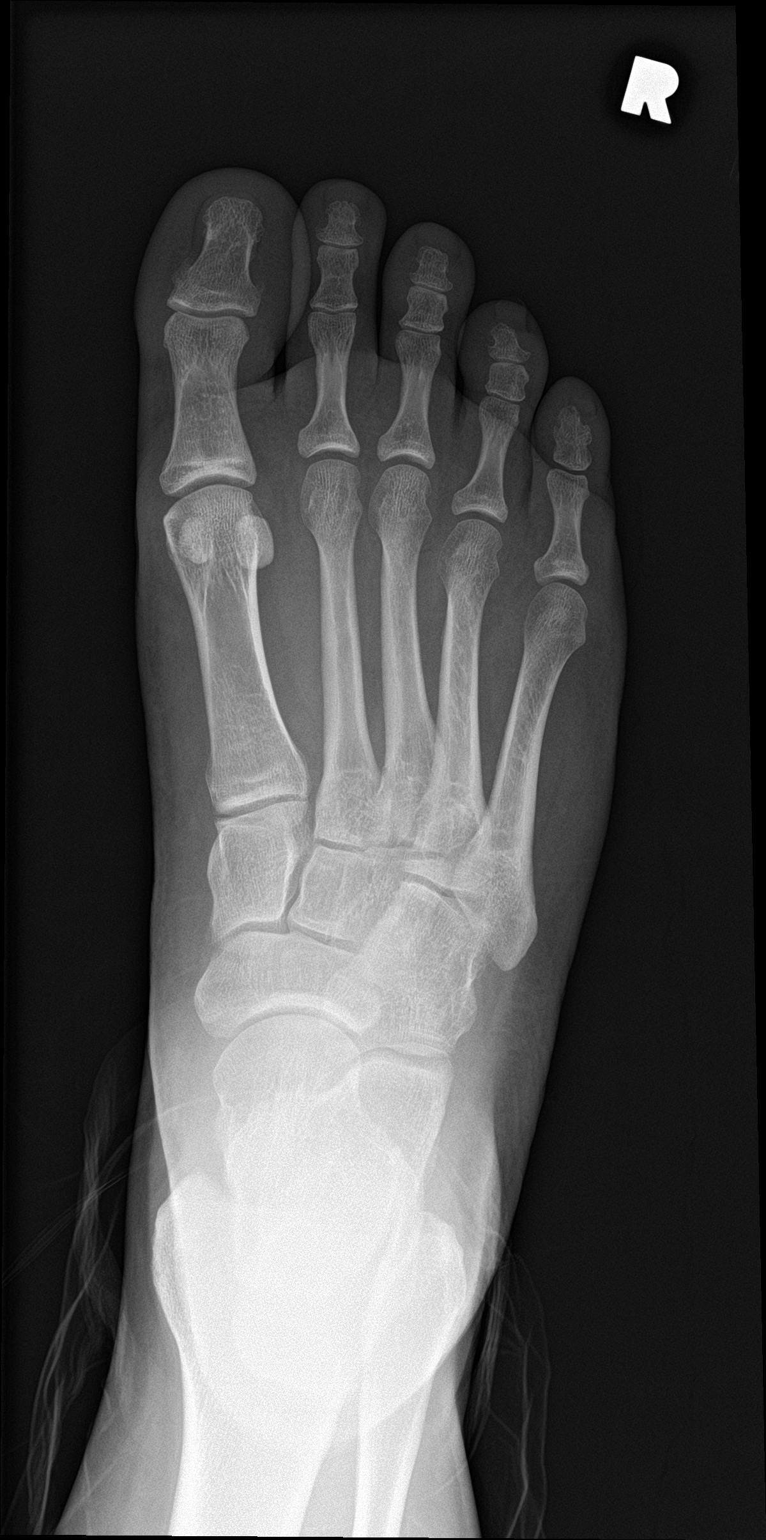

[foot obl]
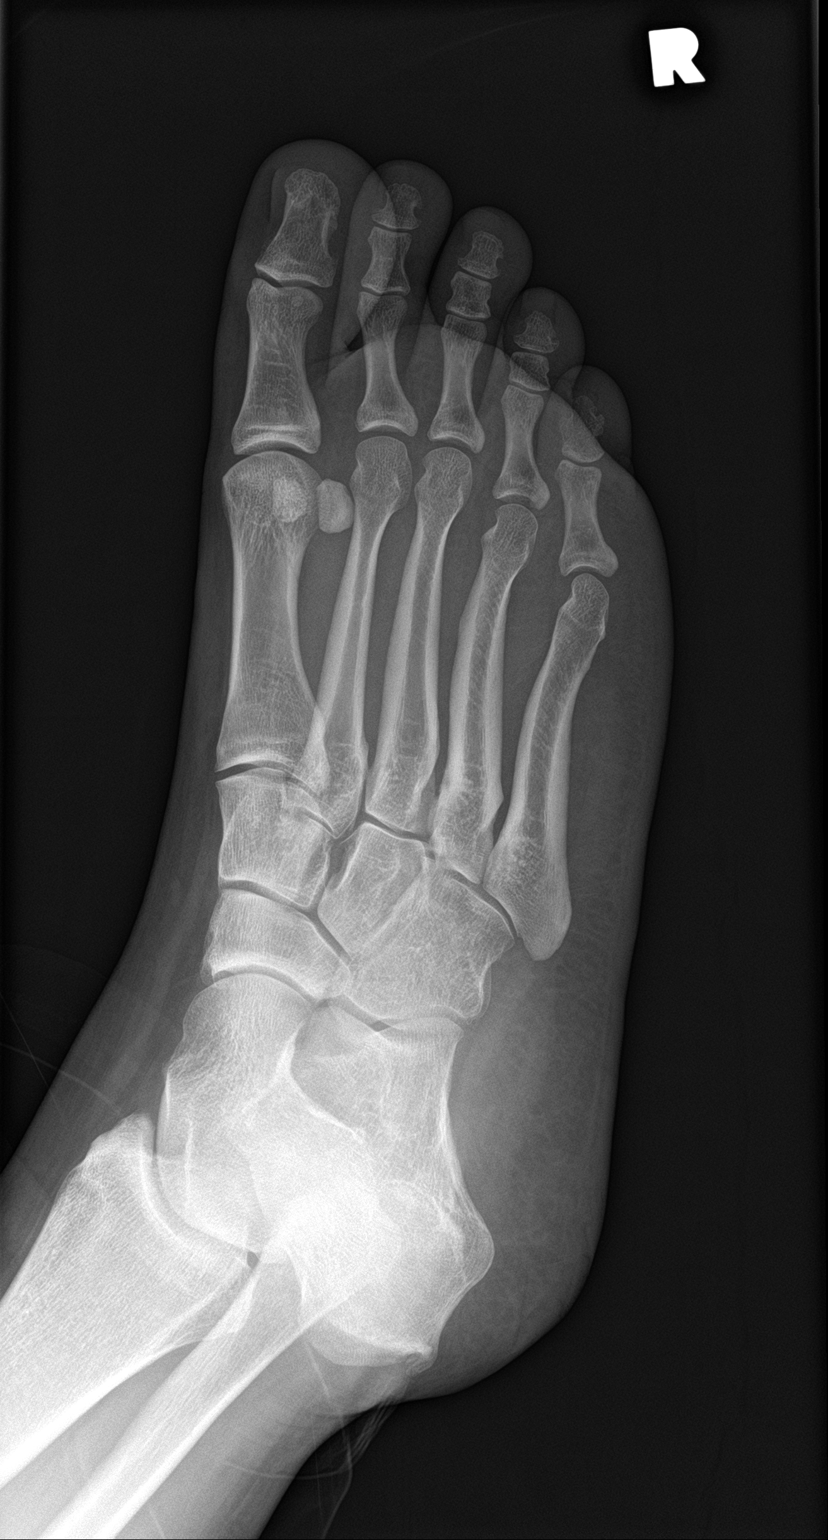

[foot lat]
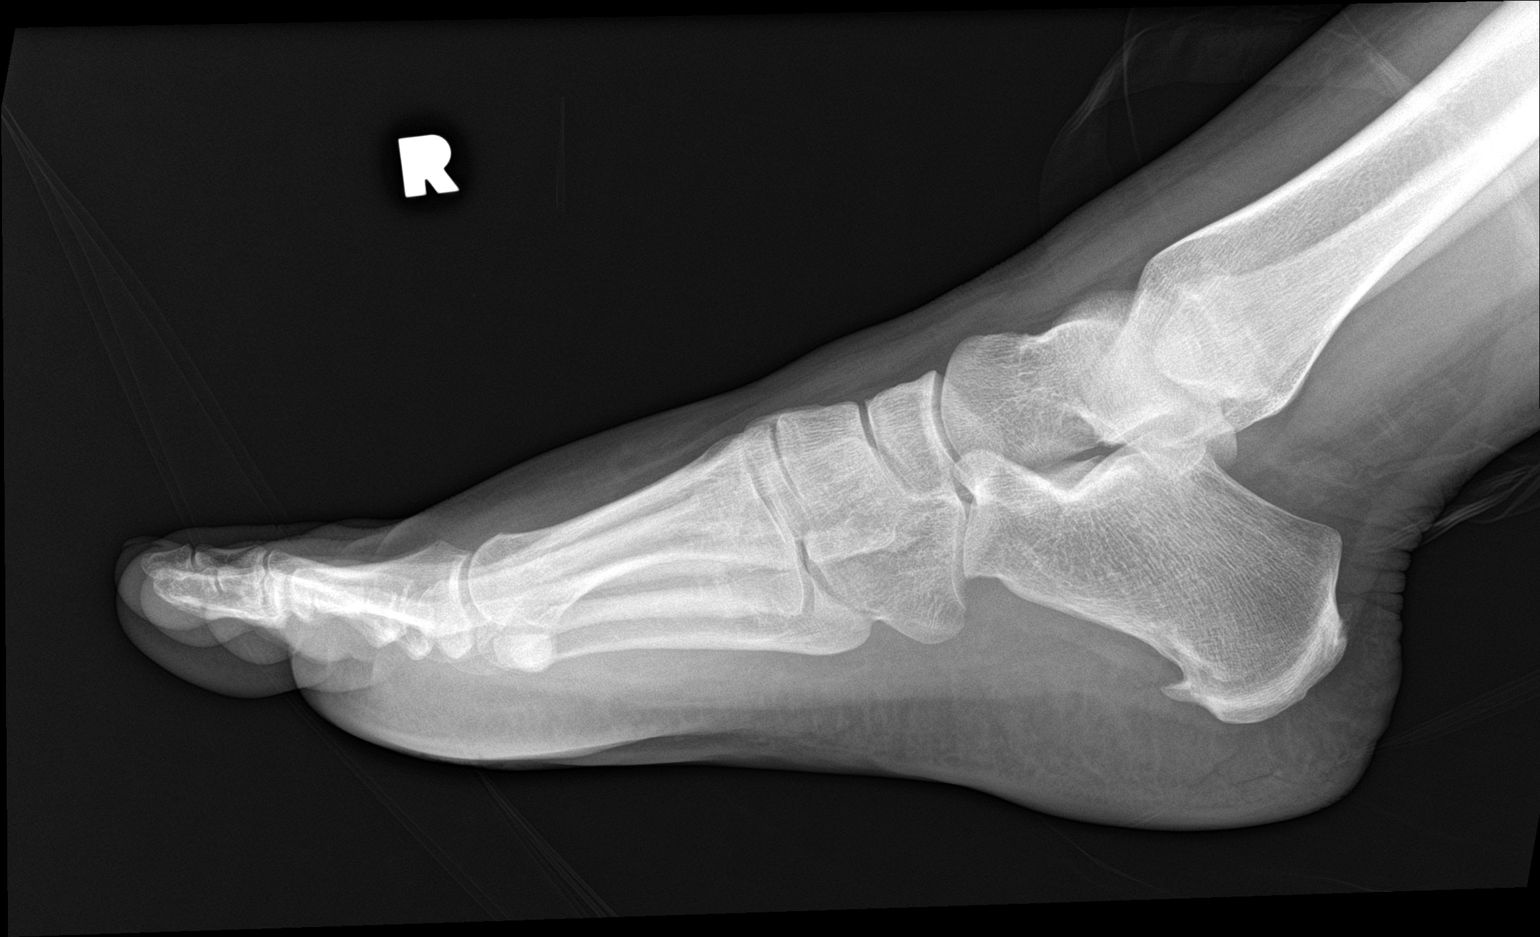

[3 of 3 positions shown; findings below may reference images not displayed]

FINDINGS: There is no evidence of fracture or dislocation. Particularly, no
evident fracture of the third toe. There is no evidence of
arthropathy or other focal bone abnormality. There is a plantar
calcaneal spur. Minimal dorsal soft tissue.
IMPRESSION: No fracture or subluxation of the right foot. Mild dorsal soft
tissue swelling.

## 2022-10-05 ENCOUNTER — Other Ambulatory Visit (HOSPITAL_BASED_OUTPATIENT_CLINIC_OR_DEPARTMENT_OTHER): Payer: Self-pay

## 2022-11-24 ENCOUNTER — Other Ambulatory Visit: Payer: Self-pay | Admitting: Family Medicine

## 2022-11-24 DIAGNOSIS — K219 Gastro-esophageal reflux disease without esophagitis: Secondary | ICD-10-CM

## 2022-11-26 ENCOUNTER — Other Ambulatory Visit (HOSPITAL_BASED_OUTPATIENT_CLINIC_OR_DEPARTMENT_OTHER): Payer: Self-pay

## 2022-11-26 ENCOUNTER — Other Ambulatory Visit: Payer: Self-pay

## 2022-11-26 MED ORDER — OMEPRAZOLE 40 MG PO CPDR
40.0000 mg | DELAYED_RELEASE_CAPSULE | Freq: Every day | ORAL | 1 refills | Status: DC
Start: 2022-11-26 — End: 2023-07-14
  Filled 2022-11-26: qty 90, 90d supply, fill #0
  Filled 2023-03-03 – 2023-03-22 (×3): qty 90, 90d supply, fill #1

## 2022-12-05 ENCOUNTER — Ambulatory Visit
Admission: RE | Admit: 2022-12-05 | Discharge: 2022-12-05 | Disposition: A | Discharge status: Home / Self Care | Payer: Medicaid Other | Source: Ambulatory Visit | Attending: Internal Medicine | Admitting: Internal Medicine

## 2022-12-05 VITALS — BP 147/93 | HR 95 | Temp 98.5°F | Resp 18

## 2022-12-05 DIAGNOSIS — J209 Acute bronchitis, unspecified: Secondary | ICD-10-CM | POA: Diagnosis not present

## 2022-12-05 DIAGNOSIS — J4530 Mild persistent asthma, uncomplicated: Secondary | ICD-10-CM

## 2022-12-05 MED ORDER — ALBUTEROL SULFATE HFA 108 (90 BASE) MCG/ACT IN AERS: 2.0000 | INHALATION_SPRAY | Freq: Four times a day (QID) | RESPIRATORY_TRACT | 0 refills | Status: AC | PRN

## 2022-12-05 MED ORDER — PREDNISONE 20 MG PO TABS: ORAL_TABLET | ORAL | 0 refills | Status: DC

## 2022-12-05 NOTE — ED Triage Notes (Signed)
Pt c/o productive cough with small brown sputum, chest congestion, and wheezing x1wk. States used her inhaler with little relief.

## 2022-12-05 NOTE — Discharge Instructions (Addendum)
Start prednisone for the bronchitis. Maintain your albuterol inhaler use. Keep taking Singulair and I would restart Zyrtec or Allegra.
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
## 2022-12-28 ENCOUNTER — Other Ambulatory Visit: Payer: Self-pay | Admitting: Medical Genetics

## 2022-12-28 DIAGNOSIS — Z006 Encounter for examination for normal comparison and control in clinical research program: Secondary | ICD-10-CM

## 2023-01-25 ENCOUNTER — Other Ambulatory Visit (HOSPITAL_COMMUNITY): Payer: Self-pay | Attending: Medical Genetics

## 2023-03-03 ENCOUNTER — Other Ambulatory Visit: Payer: Self-pay | Admitting: Family Medicine

## 2023-03-03 DIAGNOSIS — R053 Chronic cough: Secondary | ICD-10-CM

## 2023-03-03 DIAGNOSIS — F32A Depression, unspecified: Secondary | ICD-10-CM

## 2023-03-04 ENCOUNTER — Other Ambulatory Visit (HOSPITAL_BASED_OUTPATIENT_CLINIC_OR_DEPARTMENT_OTHER): Payer: Self-pay

## 2023-03-04 ENCOUNTER — Other Ambulatory Visit: Payer: Self-pay

## 2023-03-04 MED ORDER — CLONAZEPAM 0.5 MG PO TABS
0.2500 mg | ORAL_TABLET | Freq: Two times a day (BID) | ORAL | 1 refills | Status: AC | PRN
Start: 2023-03-04 — End: 2023-10-28
  Filled 2023-03-04 – 2023-03-22 (×3): qty 60, 30d supply, fill #0

## 2023-03-04 MED ORDER — FLUTICASONE PROPIONATE HFA 110 MCG/ACT IN AERO
2.0000 | INHALATION_SPRAY | Freq: Two times a day (BID) | RESPIRATORY_TRACT | 12 refills | Status: AC
Start: 2023-03-04 — End: ?
  Filled 2023-03-04 – 2023-03-22 (×3): qty 12, 30d supply, fill #0
  Filled 2023-07-14: qty 12, 30d supply, fill #1

## 2023-03-04 NOTE — Telephone Encounter (Signed)
 Requesting: Klonopin 0.5 mg  Contract: N/A UDS: N/A Last Visit: 07/23/2022  Next Visit: N/A Last Refill: 06/26/2022  Please Advise

## 2023-03-15 ENCOUNTER — Other Ambulatory Visit (HOSPITAL_BASED_OUTPATIENT_CLINIC_OR_DEPARTMENT_OTHER): Payer: Self-pay

## 2023-03-22 ENCOUNTER — Other Ambulatory Visit (HOSPITAL_BASED_OUTPATIENT_CLINIC_OR_DEPARTMENT_OTHER): Payer: Self-pay

## 2023-03-22 ENCOUNTER — Other Ambulatory Visit: Payer: Self-pay | Admitting: Family Medicine

## 2023-03-22 DIAGNOSIS — F4323 Adjustment disorder with mixed anxiety and depressed mood: Secondary | ICD-10-CM

## 2023-03-22 MED ORDER — SERTRALINE HCL 100 MG PO TABS
200.0000 mg | ORAL_TABLET | Freq: Every day | ORAL | 0 refills | Status: DC
Start: 2023-03-22 — End: 2023-07-14
  Filled 2023-03-22: qty 180, 90d supply, fill #0

## 2023-04-23 ENCOUNTER — Encounter: Payer: Self-pay | Admitting: Family Medicine

## 2023-05-09 ENCOUNTER — Ambulatory Visit (INDEPENDENT_AMBULATORY_CARE_PROVIDER_SITE_OTHER): Admitting: Family Medicine

## 2023-05-09 ENCOUNTER — Other Ambulatory Visit (HOSPITAL_COMMUNITY)
Admission: RE | Admit: 2023-05-09 | Discharge: 2023-05-09 | Disposition: A | Discharge status: Home / Self Care | Source: Ambulatory Visit | Attending: Family Medicine | Admitting: Family Medicine

## 2023-05-09 ENCOUNTER — Other Ambulatory Visit (HOSPITAL_BASED_OUTPATIENT_CLINIC_OR_DEPARTMENT_OTHER): Payer: Self-pay

## 2023-05-09 VITALS — BP 122/80 | HR 87 | Temp 97.8°F | Resp 18 | Ht 66.0 in | Wt 282.4 lb

## 2023-05-09 DIAGNOSIS — Z124 Encounter for screening for malignant neoplasm of cervix: Secondary | ICD-10-CM | POA: Insufficient documentation

## 2023-05-09 DIAGNOSIS — R300 Dysuria: Secondary | ICD-10-CM

## 2023-05-09 DIAGNOSIS — R3129 Other microscopic hematuria: Secondary | ICD-10-CM | POA: Diagnosis not present

## 2023-05-09 LAB — POCT URINALYSIS DIPSTICK
Glucose, UA: NEGATIVE
Nitrite, UA: POSITIVE
Protein, UA: POSITIVE — AB
Spec Grav, UA: 1.015 (ref 1.010–1.025)
Urobilinogen, UA: 0.2 U/dL
pH, UA: 7 (ref 5.0–8.0)

## 2023-05-09 LAB — POCT URINE PREGNANCY: Preg Test, Ur: NEGATIVE

## 2023-05-09 MED ORDER — FLUCONAZOLE 150 MG PO TABS
ORAL_TABLET | ORAL | 1 refills | Status: AC
  Filled 2023-05-09: qty 2, 8d supply, fill #0

## 2023-05-09 MED ORDER — NITROFURANTOIN MONOHYD MACRO 100 MG PO CAPS
100.0000 mg | ORAL_CAPSULE | Freq: Two times a day (BID) | ORAL | 0 refills | Status: AC
  Filled 2023-05-09: qty 14, 7d supply, fill #0

## 2023-05-09 NOTE — Patient Instructions (Addendum)
 We will treat for yeast infection with diflucan and UTI with macrobid- while your urine culture is pending I will be in touch with culture and pap Let's see if we can get a weight loss surgery consultation for you with [facility].  If you have not already do the weight loss seminar online or in person [email or web address]
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
## 2023-05-14 ENCOUNTER — Encounter: Payer: Self-pay | Admitting: Family Medicine

## 2023-05-14 ENCOUNTER — Ambulatory Visit: Payer: Self-pay | Admitting: Family Medicine

## 2023-05-14 LAB — CYTOLOGY - PAP
Comment: NEGATIVE
High risk HPV: POSITIVE — AB

## 2023-05-14 NOTE — Telephone Encounter (Signed)
 Additional Notes: Pt called in stating she had a pap smear on 3/20 and saw the results in her MyChart. Pt is requesting Dr. Patsy Lager give her a call with the results when she can.    Copied from CRM [id number]. Topic: Clinical - Lab/Test Results >> May 14, 2023 12:46 PM Sim Boast F wrote: Reason for CRM: Patient would like to discuss lab results, said they seem alarming to her Reason for Disposition  [1] Caller requesting NON-URGENT health information AND [2] PCP's office is the best resource  Answer Assessment - Initial Assessment Questions 1. REASON FOR CALL or QUESTION: "What is your reason for calling today?" or "How can I best help you?" or "What question do you have that I can help answer?"     Pap smear results  Protocols used: Information Only Call - No Triage-A-AH

## 2023-05-15 NOTE — Telephone Encounter (Signed)
 Results have been faxed

## 2023-06-18 ENCOUNTER — Encounter: Payer: Self-pay | Admitting: Family Medicine

## 2023-07-14 ENCOUNTER — Other Ambulatory Visit: Payer: Self-pay | Admitting: Family Medicine

## 2023-07-14 DIAGNOSIS — F4323 Adjustment disorder with mixed anxiety and depressed mood: Secondary | ICD-10-CM

## 2023-07-14 DIAGNOSIS — K219 Gastro-esophageal reflux disease without esophagitis: Secondary | ICD-10-CM

## 2023-07-16 ENCOUNTER — Other Ambulatory Visit: Payer: Self-pay

## 2023-07-16 ENCOUNTER — Other Ambulatory Visit (HOSPITAL_BASED_OUTPATIENT_CLINIC_OR_DEPARTMENT_OTHER): Payer: Self-pay

## 2023-07-16 MED ORDER — SERTRALINE HCL 100 MG PO TABS
200.0000 mg | ORAL_TABLET | Freq: Every day | ORAL | 1 refills | Status: DC
  Filled 2023-07-16: qty 180, 90d supply, fill #0

## 2023-07-16 MED ORDER — OMEPRAZOLE 40 MG PO CPDR
40.0000 mg | DELAYED_RELEASE_CAPSULE | Freq: Every day | ORAL | 1 refills | Status: AC
  Filled 2023-07-16: qty 90, 90d supply, fill #0

## 2023-09-16 ENCOUNTER — Encounter: Payer: Self-pay | Admitting: Family Medicine

## 2023-09-16 NOTE — Telephone Encounter (Signed)
 Spoke with patient regarding spouse. Please see documentation in spouse's chart.

## 2023-09-16 NOTE — Telephone Encounter (Signed)
**Note De-identified  [place] Obfuscation** Please advise 

## 2023-10-15 ENCOUNTER — Telehealth: Payer: Self-pay

## 2023-10-15 DIAGNOSIS — Z111 Encounter for screening for respiratory tuberculosis: Secondary | ICD-10-CM

## 2023-10-15 NOTE — Telephone Encounter (Signed)
 Copied from CRM [id number]. Topic: Clinical - Request for Lab/Test Order >> Oct 15, 2023  3:02 PM Chiquita SQUIBB wrote: Reason for CRM: Patient is calling in requesting a TB test. Please advise the patient if this can be ordered.

## 2023-10-15 NOTE — Addendum Note (Signed)
 Addended by: WATT RAISIN C on: 10/15/2023 03:21 PM   Modules accepted: Orders

## 2023-10-16 ENCOUNTER — Other Ambulatory Visit

## 2023-10-16 DIAGNOSIS — Z111 Encounter for screening for respiratory tuberculosis: Secondary | ICD-10-CM

## 2023-10-19 ENCOUNTER — Encounter: Payer: Self-pay | Admitting: Family Medicine

## 2023-10-19 LAB — QUANTIFERON-TB GOLD PLUS
Mitogen-NIL: 7.89 [IU]/mL
NIL: 0.02 [IU]/mL
QuantiFERON-TB Gold Plus: NEGATIVE
TB1-NIL: 0.01 [IU]/mL
TB2-NIL: 0.01 [IU]/mL

## 2023-10-23 ENCOUNTER — Ambulatory Visit
Admission: EM | Admit: 2023-10-23 | Discharge: 2023-10-23 | Disposition: A | Discharge status: Home / Self Care | Attending: Family Medicine | Admitting: Family Medicine

## 2023-10-23 DIAGNOSIS — H6993 Unspecified Eustachian tube disorder, bilateral: Secondary | ICD-10-CM | POA: Diagnosis not present

## 2023-10-23 DIAGNOSIS — H65192 Other acute nonsuppurative otitis media, left ear: Secondary | ICD-10-CM

## 2023-10-23 DIAGNOSIS — K529 Noninfective gastroenteritis and colitis, unspecified: Secondary | ICD-10-CM | POA: Diagnosis not present

## 2023-10-23 HISTORY — DX: Essential (primary) hypertension: I10

## 2023-10-23 MED ORDER — AMOXICILLIN 875 MG PO TABS: 875.0000 mg | ORAL_TABLET | Freq: Two times a day (BID) | ORAL | 0 refills | Status: AC

## 2023-10-23 MED ORDER — LOPERAMIDE HCL 2 MG PO CAPS: 2.0000 mg | ORAL_CAPSULE | Freq: Two times a day (BID) | ORAL | 0 refills | Status: AC | PRN

## 2023-10-23 MED ORDER — PREDNISONE 20 MG PO TABS: ORAL_TABLET | ORAL | 0 refills | Status: AC

## 2023-10-23 MED ORDER — ONDANSETRON 8 MG PO TBDP
8.0000 mg | ORAL_TABLET | Freq: Three times a day (TID) | ORAL | 0 refills | Status: AC | PRN
Start: 2023-10-23 — End: ?

## 2023-10-23 NOTE — Discharge Instructions (Signed)
 Start prednisone  for the eustachian tube dysfunction and secondary left ear infection, amoxicillin .   Make sure you push fluids drinking mostly water but mix it with Gatorade.  Try to eat light meals including soups, broths and soft foods, fruits.  You may use Zofran  for your nausea and vomiting once every 8 hours.  Imodium  can help with diarrhea but use this carefully limiting it to 1-2 times per day only if you are having a lot of diarrhea.  Please return to the clinic if symptoms worsen or you start having severe abdominal pain not helped by taking Tylenol  or start having bloody stools or blood in the vomit.

## 2023-10-23 NOTE — ED Triage Notes (Signed)
 Pt c/o bilat earache and feeling like fluid behind ears and nausea/diarrhea x 2-3 days-denies fever-no meds PTA=NAD-steady gait
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
## 2023-10-25 NOTE — Patient Instructions (Signed)
 It was good to see you again today, I will be in touch with your lab work Recommend flu shot this fall if not done already
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
## 2023-10-28 ENCOUNTER — Ambulatory Visit: Admitting: Family Medicine

## 2023-10-28 VITALS — BP 130/80 | HR 84 | Temp 98.6°F | Ht 66.0 in | Wt 279.0 lb

## 2023-10-28 DIAGNOSIS — Z23 Encounter for immunization: Secondary | ICD-10-CM

## 2023-10-28 DIAGNOSIS — Z131 Encounter for screening for diabetes mellitus: Secondary | ICD-10-CM

## 2023-10-28 DIAGNOSIS — Z0000 Encounter for general adult medical examination without abnormal findings: Secondary | ICD-10-CM

## 2023-10-28 DIAGNOSIS — Z1329 Encounter for screening for other suspected endocrine disorder: Secondary | ICD-10-CM

## 2023-10-28 DIAGNOSIS — Z130 Encounter for screening for diseases of the blood and blood-forming organs and certain disorders involving the immune mechanism: Secondary | ICD-10-CM | POA: Diagnosis not present

## 2023-10-28 DIAGNOSIS — Z13220 Encounter for screening for lipoid disorders: Secondary | ICD-10-CM

## 2023-10-28 DIAGNOSIS — F4323 Adjustment disorder with mixed anxiety and depressed mood: Secondary | ICD-10-CM

## 2023-10-28 MED ORDER — SERTRALINE HCL 100 MG PO TABS: 200.0000 mg | ORAL_TABLET | Freq: Every day | ORAL | 3 refills | Status: AC

## 2023-10-28 MED ORDER — SUCRALFATE 1 G PO TABS: 1.0000 g | ORAL_TABLET | Freq: Three times a day (TID) | ORAL | 0 refills | Status: AC

## 2023-10-29 ENCOUNTER — Encounter: Payer: Self-pay | Admitting: Family Medicine

## 2023-10-29 DIAGNOSIS — R7303 Prediabetes: Secondary | ICD-10-CM

## 2023-10-29 LAB — COMPREHENSIVE METABOLIC PANEL WITH GFR
ALT: 17 U/L (ref 0–35)
AST: 11 U/L (ref 0–37)
Albumin: 4.4 g/dL (ref 3.5–5.2)
Alkaline Phosphatase: 90 U/L (ref 39–117)
BUN: 8 mg/dL (ref 6–23)
CO2: 30 meq/L (ref 19–32)
Calcium: 10.7 mg/dL — ABNORMAL HIGH (ref 8.4–10.5)
Chloride: 102 meq/L (ref 96–112)
Creatinine, Ser: 0.71 mg/dL (ref 0.40–1.20)
GFR: 112.94 mL/min (ref >60.00)
Glucose, Bld: 84 mg/dL (ref 70–99)
Potassium: 4.1 meq/L (ref 3.5–5.1)
Sodium: 139 meq/L (ref 135–145)
Total Bilirubin: 0.2 mg/dL (ref 0.2–1.2)
Total Protein: 7.2 g/dL (ref 6.0–8.3)

## 2023-10-29 LAB — CBC
HCT: 38.3 % (ref 36.0–46.0)
Hemoglobin: 12.6 g/dL (ref 12.0–15.0)
MCHC: 33 g/dL (ref 30.0–36.0)
MCV: 82 fl (ref 78.0–100.0)
Platelets: 259 K/uL (ref 150.0–400.0)
RBC: 4.67 Mil/uL (ref 3.87–5.11)
RDW: 15.9 % — ABNORMAL HIGH (ref 11.5–15.5)
WBC: 6.3 K/uL (ref 4.0–10.5)

## 2023-10-29 LAB — LIPID PANEL
Cholesterol: 134 mg/dL (ref 0–200)
HDL: 32.4 mg/dL — ABNORMAL LOW (ref >39.00)
LDL Cholesterol: 42 mg/dL (ref 0–99)
NonHDL: 101.54
Total CHOL/HDL Ratio: 4
Triglycerides: 298 mg/dL — ABNORMAL HIGH (ref 0.0–149.0)
VLDL: 59.6 mg/dL — ABNORMAL HIGH (ref 0.0–40.0)

## 2023-10-29 LAB — TSH: TSH: 0.8 u[IU]/mL (ref 0.35–5.50)

## 2023-10-29 LAB — HEMOGLOBIN A1C: Hgb A1c MFr Bld: 6.4 % (ref 4.6–6.5)

## 2023-10-29 MED ORDER — METFORMIN HCL 500 MG PO TABS: 500.0000 mg | ORAL_TABLET | Freq: Every day | ORAL | 3 refills | Status: AC

## 2023-10-29 NOTE — Addendum Note (Signed)
 Addended by: WATT RAISIN C on: 10/29/2023 05:27 PM   Modules accepted: Orders

## 2023-11-20 ENCOUNTER — Encounter: Payer: Self-pay | Admitting: Family Medicine

## 2023-11-29 ENCOUNTER — Other Ambulatory Visit: Payer: Self-pay | Admitting: Genetic Counselor

## 2023-11-29 DIAGNOSIS — Z006 Encounter for examination for normal comparison and control in clinical research program: Secondary | ICD-10-CM

## 2024-02-08 ENCOUNTER — Encounter (HOSPITAL_COMMUNITY): Payer: Self-pay

## 2024-02-08 ENCOUNTER — Other Ambulatory Visit: Payer: Self-pay

## 2024-02-08 ENCOUNTER — Emergency Department (HOSPITAL_COMMUNITY)

## 2024-02-08 ENCOUNTER — Emergency Department (HOSPITAL_COMMUNITY)
Admission: EM | Admit: 2024-02-08 | Discharge: 2024-02-09 | Disposition: A | Attending: Student in an Organized Health Care Education/Training Program | Admitting: Student in an Organized Health Care Education/Training Program

## 2024-02-08 DIAGNOSIS — R55 Syncope and collapse: Secondary | ICD-10-CM | POA: Diagnosis present

## 2024-02-08 LAB — COMPREHENSIVE METABOLIC PANEL WITH GFR
ALT: 21 U/L (ref 0–44)
AST: 27 U/L (ref 15–41)
Albumin: 4.4 g/dL (ref 3.5–5.0)
Alkaline Phosphatase: 78 U/L (ref 38–126)
Anion gap: 11 (ref 5–15)
BUN: 15 mg/dL (ref 6–20)
CO2: 27 mmol/L (ref 22–32)
Calcium: 10.9 mg/dL — ABNORMAL HIGH (ref 8.9–10.3)
Chloride: 99 mmol/L (ref 98–111)
Creatinine, Ser: 0.93 mg/dL (ref 0.44–1.00)
GFR, Estimated: >60 mL/min
Glucose, Bld: 125 mg/dL — ABNORMAL HIGH (ref 70–99)
Potassium: 3.5 mmol/L (ref 3.5–5.1)
Sodium: 137 mmol/L (ref 135–145)
Total Bilirubin: 0.3 mg/dL (ref 0.0–1.2)
Total Protein: 7.3 g/dL (ref 6.5–8.1)

## 2024-02-08 LAB — HCG, QUANTITATIVE, PREGNANCY: hCG, Beta Chain, Quant, S: <1 m[IU]/mL

## 2024-02-08 LAB — HCG, SERUM, QUALITATIVE
Preg, Serum: POSITIVE — AB
Preg, Serum: POSITIVE — AB

## 2024-02-08 LAB — I-STAT CHEM 8, ED
BUN: 16 mg/dL (ref 6–20)
Calcium, Ion: 1.36 mmol/L (ref 1.15–1.40)
Chloride: 99 mmol/L (ref 98–111)
Creatinine, Ser: 1 mg/dL (ref 0.44–1.00)
Glucose, Bld: 124 mg/dL — ABNORMAL HIGH (ref 70–99)
HCT: 41 % (ref 36.0–46.0)
Hemoglobin: 13.9 g/dL (ref 12.0–15.0)
Potassium: 3.3 mmol/L — ABNORMAL LOW (ref 3.5–5.1)
Sodium: 139 mmol/L (ref 135–145)
TCO2: 28 mmol/L (ref 22–32)

## 2024-02-08 LAB — CBC WITH DIFFERENTIAL/PLATELET
Abs Immature Granulocytes: 0.04 K/uL (ref 0.00–0.07)
Basophils Absolute: 0.1 K/uL (ref 0.0–0.1)
Basophils Relative: 1 %
Eosinophils Absolute: 0.1 K/uL (ref 0.0–0.5)
Eosinophils Relative: 1 %
HCT: 40.2 % (ref 36.0–46.0)
Hemoglobin: 13.7 g/dL (ref 12.0–15.0)
Immature Granulocytes: 0 %
Lymphocytes Relative: 17 %
Lymphs Abs: 1.7 K/uL (ref 0.7–4.0)
MCH: 28.7 pg (ref 26.0–34.0)
MCHC: 34.1 g/dL (ref 30.0–36.0)
MCV: 84.1 fL (ref 80.0–100.0)
Monocytes Absolute: 0.8 K/uL (ref 0.1–1.0)
Monocytes Relative: 8 %
Neutro Abs: 7.5 K/uL (ref 1.7–7.7)
Neutrophils Relative %: 73 %
Platelets: 245 K/uL (ref 150–400)
RBC: 4.78 MIL/uL (ref 3.87–5.11)
RDW: 14.6 % (ref 11.5–15.5)
WBC: 10.2 K/uL (ref 4.0–10.5)
nRBC: 0 % (ref 0.0–0.2)

## 2024-02-08 LAB — MAGNESIUM: Magnesium: 1.9 mg/dL (ref 1.7–2.4)

## 2024-02-08 MED ORDER — ACETAMINOPHEN 500 MG PO TABS
1000.0000 mg | ORAL_TABLET | Freq: Once | ORAL | Status: AC
  Administered 2024-02-08: 1000 mg via ORAL
  Filled 2024-02-08: qty 2

## 2024-02-08 NOTE — ED Notes (Signed)
 Patient transported to CT

## 2024-02-08 NOTE — ED Triage Notes (Signed)
 Patient bib [facility] from home after getting dizzy and falling down about 10 stairs. Possible LOC. She hit the left posterior head. She does not take blood thinners. EMS reports that she got dizzy with them and her BP went to 86/52 and heart rate went brady to 55.

## 2024-02-08 NOTE — ED Provider Notes (Signed)
 " [facility] Provider Note   CSN: [id number] Arrival date & time: 02/08/24  8063     Patient presents with: Fall and Head Injury   Sandy Perez is a 33 y.o. female.   33 year old female presenting after syncope/fall.  Patient notes that she was walking up the stairs in her home when she began to have tunnel vision, this resulted in a syncopal event causing her to fall backwards on her stairs.  She describes pain primarily in the left posterior aspect of her head.  Has had a syncopal event previously but reports this was years ago.  Denies chest pain, shortness of breath, abdominal pain, vaginal bleeding.  History of tubal ligation and endometrial ablation, does not have a regular menstrual cycle.   Fall  Head Injury      Prior to Admission medications  Medication Sig Start Date End Date Taking? Authorizing Provider  albuterol  (VENTOLIN  HFA) 108 (90 Base) MCG/ACT inhaler Inhale 2 puffs into the lungs every 6 (six) hours as needed for wheezing or shortness of breath (Cough). 12/05/22   Christopher Savannah, PA-C  amoxicillin  (AMOXIL ) 875 MG tablet Take 1 tablet (875 mg total) by mouth 2 (two) times daily. 10/23/23   Christopher Savannah, PA-C  cetirizine  (ZYRTEC  ALLERGY) 10 MG tablet Take 1 tablet (10 mg total) by mouth at bedtime. 01/30/22 10/28/23  Joesph Shaver Scales, PA-C  clonazePAM  (KLONOPIN ) 0.5 MG tablet Take 0.5-1 tablets (0.25-0.5 mg total) by mouth 2 (two) times daily as needed. 03/04/23 10/28/23  Copland, Harlene BROCKS, MD  cyclobenzaprine  (FLEXERIL ) 10 MG tablet Take 1 tablet (10 mg total) by mouth 2 (two) times daily as needed for muscle spasms. 06/12/22   Maranda Jamee Jacob, MD  fluconazole  (DIFLUCAN ) 150 MG tablet Take one tablet by mouth, then repeat in 7 days if needed for yeast vaginitis 05/09/23   Copland, Harlene BROCKS, MD  fluticasone  (FLOVENT  HFA) 110 MCG/ACT inhaler Inhale 2 puffs into the lungs 2 (two) times daily. 03/04/23   Copland, Jessica C,  MD  hydrochlorothiazide (HYDRODIURIL) 25 MG tablet Take 25 mg by mouth every morning. 08/02/23   [provider]  lisinopril (ZESTRIL) 5 MG tablet Take 5 mg by mouth daily. 10/17/23   [provider]  loperamide  (IMODIUM ) 2 MG capsule Take 1 capsule (2 mg total) by mouth 2 (two) times daily as needed for diarrhea or loose stools. 10/23/23   Christopher Savannah, PA-C  metFORMIN  (GLUCOPHAGE ) 500 MG tablet Take 1 tablet (500 mg total) by mouth daily. 10/29/23   Copland, Harlene BROCKS, MD  nitrofurantoin , macrocrystal-monohydrate, (MACROBID ) 100 MG capsule Take 1 capsule (100 mg total) by mouth 2 (two) times daily. 05/09/23   Copland, Harlene BROCKS, MD  omeprazole  (PRILOSEC) 40 MG capsule Take 1 capsule (40 mg total) by mouth daily. 07/16/23   Copland, Harlene BROCKS, MD  ondansetron  (ZOFRAN -ODT) 8 MG disintegrating tablet Take 1 tablet (8 mg total) by mouth every 8 (eight) hours as needed for nausea or vomiting. 10/23/23   Christopher Savannah, PA-C  predniSONE  (DELTASONE ) 20 MG tablet Take 2 tablets daily with breakfast. 10/23/23   Christopher Savannah, PA-C  sertraline  (ZOLOFT ) 100 MG tablet Take 2 tablets (200 mg total) by mouth daily. 10/28/23   Copland, Harlene BROCKS, MD  sucralfate  (CARAFATE ) 1 g tablet Take 1 tablet (1 g total) by mouth 4 (four) times daily -  with meals and at bedtime. 10/28/23   Copland, Harlene BROCKS, MD    Allergies: Aripiprazole , Imitrex  [  sumatriptan], and Wellbutrin  [bupropion ]    Review of Systems  Updated Vital Signs  Vitals:   02/08/24 1941 02/08/24 2020 02/08/24 2030 02/08/24 2100  BP:  111/67 120/82 109/77  Pulse:  93 84 90  Resp:  11 18 20   Temp:      SpO2:  100% 100% 100%  Weight: 125.6 kg     Height: 5' 6 (1.676 m)        Physical Exam Vitals and nursing note reviewed.  HENT:     Head: Normocephalic.      Comments: No Battle sign or raccoon eyes Eyes:     Extraocular Movements: Extraocular movements intact.  Cardiovascular:     Rate and Rhythm: Normal rate and regular rhythm.      Heart sounds: Normal heart sounds.  Pulmonary:     Effort: Pulmonary effort is normal.     Breath sounds: Normal breath sounds.  Abdominal:     Palpations: Abdomen is soft.     Tenderness: There is no abdominal tenderness. There is no guarding.  Musculoskeletal:     Cervical back: Normal range of motion.     Comments: Moves all extremities spontaneously without difficulty  Skin:    General: Skin is warm and dry.  Neurological:     Mental Status: She is alert and oriented to person, place, and time.     (all labs ordered are listed, but only abnormal results are displayed) Labs Reviewed  COMPREHENSIVE METABOLIC PANEL WITH GFR - Abnormal; Notable for the following components:      Result Value   Glucose, Bld 125 (*)    Calcium  10.9 (*)    All other components within normal limits  HCG, SERUM, QUALITATIVE - Abnormal; Notable for the following components:   Preg, Serum POSITIVE (*)    All other components within normal limits  I-STAT CHEM 8, ED - Abnormal; Notable for the following components:   Potassium 3.3 (*)    Glucose, Bld 124 (*)    All other components within normal limits  CBC WITH DIFFERENTIAL/PLATELET  MAGNESIUM     EKG: None  Radiology: CT Cervical Spine Wo Contrast Result Date: 02/08/2024 EXAM: CT CERVICAL SPINE WITHOUT CONTRAST 02/08/2024 08:40:42 PM TECHNIQUE: CT of the cervical spine was performed without the administration of intravenous contrast. Multiplanar reformatted images are provided for review. Automated exposure control, iterative reconstruction, and/or weight based adjustment of the mA/kV was utilized to reduce the radiation dose to as low as reasonably achievable. COMPARISON: 03/01/2016 CLINICAL HISTORY: Neck trauma, dangerous injury mechanism (Age 32-64y) FINDINGS: BONES AND ALIGNMENT: No acute fracture or traumatic malalignment. DEGENERATIVE CHANGES: No significant degenerative changes. SOFT TISSUES: No prevertebral soft tissue swelling. IMPRESSION:  1. No acute findings. Electronically signed by: Norman Gatlin MD 02/08/2024 08:47 PM EST RP Workstation: [id number]   CT Head Wo Contrast Result Date: 02/08/2024 EXAM: CT HEAD WITHOUT CONTRAST 02/08/2024 08:40:42 PM TECHNIQUE: CT of the head was performed without the administration of intravenous contrast. Automated exposure control, iterative reconstruction, and/or weight based adjustment of the mA/kV was utilized to reduce the radiation dose to as low as reasonably achievable. COMPARISON: None available. CLINICAL HISTORY: Head trauma, moderate-severe FINDINGS: BRAIN AND VENTRICLES: No acute hemorrhage. No evidence of acute infarct. No hydrocephalus. No extra-axial collection. No mass effect or midline shift. ORBITS: No acute abnormality. SINUSES: No acute abnormality. SOFT TISSUES AND SKULL: Left posterior parietal-occipital scalp soft tissue swelling and hematoma. No skull fracture. IMPRESSION: 1. No acute intracranial abnormality. 2. Left posterior parietal-occipital scalp  soft tissue swelling and hematoma. Electronically signed by: Norman Gatlin MD 02/08/2024 08:45 PM EST RP Workstation: [id number]     Procedures   Medications Ordered in the ED - No data to display                                  Medical Decision Making This patient presents to the ED for concern of syncope/fall, this involves an extensive number of treatment options, and is a complaint that carries with it a high risk of complications and morbidity.  The differential diagnosis includes fracture, dislocation, contusion, concussion, intracranial hemorrhage   Co morbidities that complicate the patient evaluation  Migraines   Additional history obtained:  Additional history obtained from record review External records from outside source obtained and reviewed including prior urgent care note   Lab Tests:  I Ordered, and personally interpreted labs.  The pertinent results include: CBC within normal limits.  CMP  unremarkable.  Magnesium  within normal limits.  Serum hCG positive, repeat remains positive.  Qualitative hCG <1, repeat pending. Urine hcg pending   Imaging Studies ordered:  I ordered imaging studies including CT head, CT C-spine, XR chest, XR L knee I independently visualized and interpreted imaging which showed  - CT head:1. No acute intracranial abnormality. 2. Left posterior parietal-occipital scalp soft tissue swelling and hematoma. - CT C-spine: 1. No acute findings. - CXR: 1. No acute cardiopulmonary abnormality. - L knee XR: 1. No significant abnormality. I agree with the radiologist interpretation   Cardiac Monitoring: / EKG:  The patient was maintained on a cardiac monitor.  I personally viewed and interpreted the cardiac monitored which showed an underlying rhythm of: NSR   Consultations Obtained:  I requested consultation with the OBGYN,  and discussed lab and imaging findings as well as pertinent plan - they recommend: I spoke with Dr. Abigail via secure chat who states this can sometimes happen with heterophilic antibodies causing a false positive, recommends obtaining urine hcg and repeat quantitative hcg. If either of these are positive, recommends OBGYN follow-up.   Problem List / ED Course / Critical interventions / Medication management  I ordered medication including Tylenol  for headache Reevaluation of the patient after these medicines showed that the patient improved I have reviewed the patients home medicines and have made adjustments as needed   Social Determinants of Health:  Depression   Test / Admission - Considered:  Physical exam notable as above, patient had rigid c-collar in place at time of my initial assessment however once C-spine was cleared on CT imaging as above I was able to remove this, she does not have any midline spinous process tenderness to palpation and is able to demonstrate full range of motion of the neck.  Patient does have some  tenderness to palpation in the region of the left posterior occiput, this is consistent with her fall.   Labs are generally unremarkable, however patient found to have a positive serum hCG, this was repeated and was positive x 2.  Quantitative hCG negative.  I discussed this patient's case with the OB/GYN service as above, who recommended repeating quantitative hCG as well as urine hCG.  Falsely positive serum hCG can be caused in the presence of hydrophilic antibodies, see above for additional details on plan.  Repeat quantitative hCG and urine hCG pending at time of shift change. Etiology of syncopal event is unclear.   I discussed this  patient's case with the oncoming EDP [facility], see their note for lab/imaging results and additional assessment/plan.     Amount and/or Complexity of Data Reviewed Labs: ordered.  Risk OTC drugs.        Final diagnoses:  None    ED Discharge Orders     None          Glendia Rocky LOISE DEVONNA 02/09/24 0011    Lowther, Amy, DO 02/16/24 [id number]  "

## 2024-02-08 NOTE — ED Provider Triage Note (Signed)
 Emergency Medicine Provider Triage Evaluation Note  Sandy Perez , a 33 y.o. female  was evaluated in triage.  Pt complains of syncope and fall.  Patient reports that she felt lightheaded and then passed out while she was standing at the top of a flight of stairs.  She woke up on the bottom of the stairs.  She fell down about 10 stairs per her report.  She complains of pain to the posterior head.  She also complains of left knee pain.  She denies chest pain or shortness of breath.  Review of Systems  Positive: Fall, head injury, syncope Negative: Chest pain, shortness of breath  Physical Exam  BP 114/64   Pulse 93   Temp 98.3 F (36.8 C)   Resp 18   Ht 5' 6 (1.676 m)   Wt 125.6 kg   SpO2 99%   BMI 44.71 kg/m  Gen:   Awake, no distress   Resp:  Normal effort  MSK:   Moves extremities without difficulty  Other:    Medical Decision Making  Medically screening exam initiated at 7:58 PM.  Appropriate orders placed.  Sandy Perez was informed that the remainder of the evaluation will be completed by another provider, this initial triage assessment does not replace that evaluation, and the importance of remaining in the ED until their evaluation is complete.     Laurice Maude BROCKS, MD 02/08/24 [phone]

## 2024-02-08 NOTE — ED Provider Notes (Incomplete)
 " [facility] Provider Note   CSN: [id number] Arrival date & time: 02/08/24  8063     Patient presents with: Fall and Head Injury   Sandy Perez is a 33 y.o. female.  {Add pertinent medical, surgical, social history, OB history to HPI:5766} 33 year old female presenting after syncope/fall.  Patient notes that she was walking up the stairs in her home when she began to have tunnel vision, this resulted in a syncopal event causing her to fall backwards on her stairs.  She describes pain primarily in the left posterior aspect of her head.  Has had a syncopal event previously but reports this was years ago.  Denies chest pain, shortness of breath, abdominal pain, vaginal bleeding.  History of tubal ligation and endometrial ablation, does not have a regular menstrual cycle.   Fall  Head Injury      Prior to Admission medications  Medication Sig Start Date End Date Taking? Authorizing Provider  albuterol  (VENTOLIN  HFA) 108 (90 Base) MCG/ACT inhaler Inhale 2 puffs into the lungs every 6 (six) hours as needed for wheezing or shortness of breath (Cough). 12/05/22   Christopher Savannah, PA-C  amoxicillin  (AMOXIL ) 875 MG tablet Take 1 tablet (875 mg total) by mouth 2 (two) times daily. 10/23/23   Christopher Savannah, PA-C  cetirizine  (ZYRTEC  ALLERGY) 10 MG tablet Take 1 tablet (10 mg total) by mouth at bedtime. 01/30/22 10/28/23  Joesph Shaver Scales, PA-C  clonazePAM  (KLONOPIN ) 0.5 MG tablet Take 0.5-1 tablets (0.25-0.5 mg total) by mouth 2 (two) times daily as needed. 03/04/23 10/28/23  Copland, Harlene BROCKS, MD  cyclobenzaprine  (FLEXERIL ) 10 MG tablet Take 1 tablet (10 mg total) by mouth 2 (two) times daily as needed for muscle spasms. 06/12/22   Maranda Jamee Jacob, MD  fluconazole  (DIFLUCAN ) 150 MG tablet Take one tablet by mouth, then repeat in 7 days if needed for yeast vaginitis 05/09/23   Copland, Harlene BROCKS, MD  fluticasone  (FLOVENT  HFA) 110 MCG/ACT inhaler Inhale  2 puffs into the lungs 2 (two) times daily. 03/04/23   Copland, Jessica C, MD  hydrochlorothiazide (HYDRODIURIL) 25 MG tablet Take 25 mg by mouth every morning. 08/02/23   [provider]  lisinopril (ZESTRIL) 5 MG tablet Take 5 mg by mouth daily. 10/17/23   [provider]  loperamide  (IMODIUM ) 2 MG capsule Take 1 capsule (2 mg total) by mouth 2 (two) times daily as needed for diarrhea or loose stools. 10/23/23   Christopher Savannah, PA-C  metFORMIN  (GLUCOPHAGE ) 500 MG tablet Take 1 tablet (500 mg total) by mouth daily. 10/29/23   Copland, Harlene BROCKS, MD  nitrofurantoin , macrocrystal-monohydrate, (MACROBID ) 100 MG capsule Take 1 capsule (100 mg total) by mouth 2 (two) times daily. 05/09/23   Copland, Harlene BROCKS, MD  omeprazole  (PRILOSEC) 40 MG capsule Take 1 capsule (40 mg total) by mouth daily. 07/16/23   Copland, Harlene BROCKS, MD  ondansetron  (ZOFRAN -ODT) 8 MG disintegrating tablet Take 1 tablet (8 mg total) by mouth every 8 (eight) hours as needed for nausea or vomiting. 10/23/23   Christopher Savannah, PA-C  predniSONE  (DELTASONE ) 20 MG tablet Take 2 tablets daily with breakfast. 10/23/23   Christopher Savannah, PA-C  sertraline  (ZOLOFT ) 100 MG tablet Take 2 tablets (200 mg total) by mouth daily. 10/28/23   Copland, Harlene BROCKS, MD  sucralfate  (CARAFATE ) 1 g tablet Take 1 tablet (1 g total) by mouth 4 (four) times daily -  with meals and at bedtime. 10/28/23   Copland,  Harlene BROCKS, MD    Allergies: Aripiprazole , Imitrex  [sumatriptan], and Wellbutrin  [bupropion ]    Review of Systems  Updated Vital Signs  Vitals:   02/08/24 1941 02/08/24 2020 02/08/24 2030 02/08/24 2100  BP:  111/67 120/82 109/77  Pulse:  93 84 90  Resp:  11 18 20   Temp:      SpO2:  100% 100% 100%  Weight: 125.6 kg     Height: 5' 6 (1.676 m)        Physical Exam Vitals and nursing note reviewed.  HENT:     Head: Normocephalic.      Comments: No Battle sign or raccoon eyes Eyes:     Extraocular Movements: Extraocular movements intact.   Cardiovascular:     Rate and Rhythm: Normal rate and regular rhythm.     Heart sounds: Normal heart sounds.  Pulmonary:     Effort: Pulmonary effort is normal.     Breath sounds: Normal breath sounds.  Abdominal:     Palpations: Abdomen is soft.     Tenderness: There is no abdominal tenderness. There is no guarding.  Musculoskeletal:     Cervical back: Normal range of motion.     Comments: Moves all extremities spontaneously without difficulty  Skin:    General: Skin is warm and dry.  Neurological:     Mental Status: She is alert and oriented to person, place, and time.     (all labs ordered are listed, but only abnormal results are displayed) Labs Reviewed  COMPREHENSIVE METABOLIC PANEL WITH GFR - Abnormal; Notable for the following components:      Result Value   Glucose, Bld 125 (*)    Calcium  10.9 (*)    All other components within normal limits  HCG, SERUM, QUALITATIVE - Abnormal; Notable for the following components:   Preg, Serum POSITIVE (*)    All other components within normal limits  I-STAT CHEM 8, ED - Abnormal; Notable for the following components:   Potassium 3.3 (*)    Glucose, Bld 124 (*)    All other components within normal limits  CBC WITH DIFFERENTIAL/PLATELET  MAGNESIUM     EKG: None  Radiology: CT Cervical Spine Wo Contrast Result Date: 02/08/2024 EXAM: CT CERVICAL SPINE WITHOUT CONTRAST 02/08/2024 08:40:42 PM TECHNIQUE: CT of the cervical spine was performed without the administration of intravenous contrast. Multiplanar reformatted images are provided for review. Automated exposure control, iterative reconstruction, and/or weight based adjustment of the mA/kV was utilized to reduce the radiation dose to as low as reasonably achievable. COMPARISON: 03/01/2016 CLINICAL HISTORY: Neck trauma, dangerous injury mechanism (Age 36-64y) FINDINGS: BONES AND ALIGNMENT: No acute fracture or traumatic malalignment. DEGENERATIVE CHANGES: No significant  degenerative changes. SOFT TISSUES: No prevertebral soft tissue swelling. IMPRESSION: 1. No acute findings. Electronically signed by: Norman Gatlin MD 02/08/2024 08:47 PM EST RP Workstation: [id number]   CT Head Wo Contrast Result Date: 02/08/2024 EXAM: CT HEAD WITHOUT CONTRAST 02/08/2024 08:40:42 PM TECHNIQUE: CT of the head was performed without the administration of intravenous contrast. Automated exposure control, iterative reconstruction, and/or weight based adjustment of the mA/kV was utilized to reduce the radiation dose to as low as reasonably achievable. COMPARISON: None available. CLINICAL HISTORY: Head trauma, moderate-severe FINDINGS: BRAIN AND VENTRICLES: No acute hemorrhage. No evidence of acute infarct. No hydrocephalus. No extra-axial collection. No mass effect or midline shift. ORBITS: No acute abnormality. SINUSES: No acute abnormality. SOFT TISSUES AND SKULL: Left posterior parietal-occipital scalp soft tissue swelling and hematoma. No skull fracture. IMPRESSION:  1. No acute intracranial abnormality. 2. Left posterior parietal-occipital scalp soft tissue swelling and hematoma. Electronically signed by: Norman Gatlin MD 02/08/2024 08:45 PM EST RP Workstation: [id number]    {Document cardiac monitor, telemetry assessment procedure when appropriate:32947} Procedures   Medications Ordered in the ED - No data to display    {Click here for ABCD2, HEART and other calculators REFRESH Note before signing:1}                              Medical Decision Making This patient presents to the ED for concern of syncope/fall, this involves an extensive number of treatment options, and is a complaint that carries with it a high risk of complications and morbidity.  The differential diagnosis includes fracture, dislocation, contusion, concussion, intracranial hemorrhage   Co morbidities that complicate the patient evaluation  Migraines   Additional history obtained:  Additional history  obtained from record review External records from outside source obtained and reviewed including prior urgent care note   Lab Tests:  I Ordered, and personally interpreted labs.  The pertinent results include: CBC within normal limits.  CMP unremarkable.  Magnesium  within normal limits.  Serum hCG positive, repeat remains positive.  Qualitative hCG <1, repeat pending. Urine hcg pending   Imaging Studies ordered:  I ordered imaging studies including CT head, CT C-spine, XR chest, XR L knee I independently visualized and interpreted imaging which showed  - CT head:1. No acute intracranial abnormality. 2. Left posterior parietal-occipital scalp soft tissue swelling and hematoma. - CT C-spine: 1. No acute findings. - CXR: 1. No acute cardiopulmonary abnormality. - L knee XR: 1. No significant abnormality. I agree with the radiologist interpretation   Cardiac Monitoring: / EKG:  The patient was maintained on a cardiac monitor.  I personally viewed and interpreted the cardiac monitored which showed an underlying rhythm of: NSR   Consultations Obtained:  I requested consultation with the OBGYN,  and discussed lab and imaging findings as well as pertinent plan - they recommend: I spoke with Dr. Abigail via secure chat who states this can sometimes happen with heterophilic antibodies causing a false positive, recommends obtaining urine hcg and repeat quantitative hcg. If either of these are positive, recommends OBGYN follow-up.   Problem List / ED Course / Critical interventions / Medication management  I ordered medication including Tylenol  for headache Reevaluation of the patient after these medicines showed that the patient improved I have reviewed the patients home medicines and have made adjustments as needed   Social Determinants of Health:  Depression   Test / Admission - Considered:  Physical exam notable as above, patient had rigid c-collar in place at time of my initial  assessment however once C-spine was cleared on CT imaging as above I was able to remove this, she does not have any midline spinous process tenderness to palpation and is able to demonstrate full range of motion of the neck.  Patient does have some tenderness to palpation in the region of the left posterior occiput, this is consistent with her fall.  Yes    Amount and/or Complexity of Data Reviewed Labs: ordered.  Risk OTC drugs.   ***  {Document critical care time when appropriate  Document review of labs and clinical decision tools ie CHADS2VASC2, etc  Document your independent review of radiology images and any outside records  Document your discussion with family members, caretakers and with consultants  Document social determinants  of health affecting pt's care  Document your decision making why or why not admission, treatments were needed:32947:::1}   Final diagnoses:  None    ED Discharge Orders     None        "

## 2024-02-09 LAB — HCG, QUANTITATIVE, PREGNANCY: hCG, Beta Chain, Quant, S: <1 m[IU]/mL

## 2024-02-09 LAB — PREGNANCY, URINE: Preg Test, Ur: NEGATIVE

## 2024-02-09 NOTE — Discharge Instructions (Addendum)
 Your workup this evening was reassuring.  Please follow-up with your primary care provider.  The pregnancy test this evening may have been a lab error.  The urine pregnancy and the quantitative pregnancy were both negative.  Please also discuss this with your primary care provider.  Return to the emergency department if you develop any life-threatening symptoms.

## 2024-02-09 NOTE — ED Provider Notes (Signed)
" °  Physical Exam  BP 120/71   Pulse 76   Temp 98.1 F (36.7 C) (Oral)   Resp 11   Ht 5' 6 (1.676 m)   Wt 125.6 kg   SpO2 99%   BMI 44.71 kg/m   Physical Exam  Procedures  Procedures  ED Course / MDM    Medical Decision Making Amount and/or Complexity of Data Reviewed Labs: ordered.  Risk OTC drugs.   Patient care assumed at shift handoff with plan to follow-up on inconsistent pregnancy test results.  Patient with tubal ligation, had been evaluated for syncopal episode with fall.  Qualitative hCG was positive but quantitative hCG was less than 1.  GYN recommended repeat with urine and repeat quantitative testing.  If negative patient can follow-up with primary care.  If positive patient will need to follow-up with OB/GYN  Urine pregnancy and repeat quantitative hCG were both negative.  Patient may follow-up with primary care for further evaluation as needed.  Discharge instructions related to syncope and fall provided.       Sandy Perez Sandy Perez 02/09/24 0058    Trine Raynell Moder, MD 02/09/24 [phone]  "
